# Patient Record
Sex: Female | Born: 1960 | Race: Black or African American | Hispanic: No | Marital: Married | Smoking: Never smoker
Health system: Southern US, Community
[De-identification: ages and names within clinical notes are randomized; demographics above are authoritative.]

## PROBLEM LIST (undated history)

## (undated) DIAGNOSIS — E785 Hyperlipidemia, unspecified: Secondary | ICD-10-CM

## (undated) DIAGNOSIS — I10 Essential (primary) hypertension: Secondary | ICD-10-CM

## (undated) HISTORY — DX: Essential (primary) hypertension: I10

## (undated) HISTORY — DX: Hyperlipidemia, unspecified: E78.5

---

## 2003-09-24 ENCOUNTER — Other Ambulatory Visit: Payer: Self-pay

## 2014-01-14 ENCOUNTER — Ambulatory Visit: Payer: Self-pay | Admitting: Family Medicine

## 2014-01-21 ENCOUNTER — Ambulatory Visit: Payer: Self-pay | Admitting: Family Medicine

## 2014-01-27 ENCOUNTER — Ambulatory Visit: Payer: Self-pay | Admitting: Family Medicine

## 2014-01-27 HISTORY — PX: BREAST BIOPSY: SHX20

## 2014-01-29 LAB — PATHOLOGY REPORT

## 2014-02-28 ENCOUNTER — Ambulatory Visit: Payer: Self-pay | Admitting: Gastroenterology

## 2014-10-06 DIAGNOSIS — I10 Essential (primary) hypertension: Secondary | ICD-10-CM | POA: Insufficient documentation

## 2014-10-06 DIAGNOSIS — E785 Hyperlipidemia, unspecified: Secondary | ICD-10-CM | POA: Insufficient documentation

## 2015-12-31 ENCOUNTER — Ambulatory Visit (INDEPENDENT_AMBULATORY_CARE_PROVIDER_SITE_OTHER): Payer: BLUE CROSS/BLUE SHIELD | Admitting: Podiatry

## 2015-12-31 ENCOUNTER — Encounter: Payer: Self-pay | Admitting: Podiatry

## 2015-12-31 ENCOUNTER — Ambulatory Visit (INDEPENDENT_AMBULATORY_CARE_PROVIDER_SITE_OTHER): Payer: BLUE CROSS/BLUE SHIELD

## 2015-12-31 DIAGNOSIS — M7730 Calcaneal spur, unspecified foot: Secondary | ICD-10-CM | POA: Insufficient documentation

## 2015-12-31 DIAGNOSIS — M79674 Pain in right toe(s): Secondary | ICD-10-CM

## 2015-12-31 DIAGNOSIS — L603 Nail dystrophy: Secondary | ICD-10-CM | POA: Diagnosis not present

## 2015-12-31 DIAGNOSIS — M722 Plantar fascial fibromatosis: Secondary | ICD-10-CM

## 2015-12-31 DIAGNOSIS — R52 Pain, unspecified: Secondary | ICD-10-CM

## 2015-12-31 DIAGNOSIS — M7732 Calcaneal spur, left foot: Secondary | ICD-10-CM

## 2015-12-31 MED ORDER — MELOXICAM 15 MG PO TABS: 15.0000 mg | ORAL_TABLET | Freq: Every day | ORAL | Status: DC

## 2015-12-31 NOTE — Progress Notes (Signed)
Subjective:    Patient ID: LYDEA FAYE, female    DOB: Feb 03, 1961, 55 y.o.   MRN: [id number]  HPI  Ms. Espindola presents the also concerns of left heel pain. She states that she first had pain 2015 which lasted for short time and she took over-the-counter anti-inflammatories and the pain did resolve. She states the pain started again in October 2016 to the bottom of her heel. She states that she has pain in the morning she first gets up and then I proceeded working all day at work. She is been icing as well as taking anti-inflammatories and stretching without any relief. She states this turned issue to the back of her heel as well. She denies any swelling or redness. No tingling or numbness. She's had no other treatment. No recent injury or trauma.  She also states that she has a thick, painful nail on the right big toe. She states this area is very painful with shoe gear and pressure. Denies any redness or drainage. She has tried trimming the nail the any relief. No other complaints.   Review of Systems  All other systems reviewed and are negative.      Objective:   Physical Exam General: AAO x3, NAD  Dermatological: The right hallux nail is signficantly hypertrophic, dystrophic and loosen the underlying nail bed. Upon debridement a small amount of drainage underneath the toenail. There is tenderness the entire toenail. Remainder the nails are also somewhat dystrophic, discolored, there is no pain to the toenails. There is no open lesions or pre-ulcerative lesions.  Vascular: Dorsalis Pedis artery and Posterior Tibial artery pedal pulses are 2/4 bilateral with immedate capillary fill time. Pedal hair growth present. No varicosities and no lower extremity edema present bilateral. There is no pain with calf compression, swelling, warmth, erythema.   Neruologic: Grossly intact via light touch bilateral. Vibratory intact via tuning fork bilateral. Protective threshold with Semmes Wienstein  monofilament intact to all pedal sites bilateral. Patellar and Achilles deep tendon reflexes 2+ bilateral. No Babinski or clonus noted bilateral.   Musculoskeletal: Tenderness to palpation along the plantar medial tubercle of the calcaneus at the insertion of plantar fascia on the left foot. There is no pain along the course of the plantar fascia within the arch of the foot. Plantar fascia appears to be intact. There is no pain with lateral compression of the calcaneus or pain with vibratory sensation. There is tenderness along the distal portion the Achilles tendon on the insertion of the calcaneus objective we have there is no pain at this time. There is no defect in the tendon and Thompson test is negative. No other areas of tenderness to bilateral lower extremities.   Gait: Unassisted, Nonantalgic.     Assessment & Plan:  55 year old female left heel pain, likely plantar fasciitis; symptomatic onychodystrophy right hallux -Treatment options discussed including all alternatives, risks, and complications  1. Left heel pain; plantar fasciitis -Etiology of symptoms were discussed -X-rays were obtained and reviewed. Small heel spur. No evidence of acute fracture or stress fracture. -Patient elects to proceed with steroid injection into the left heel. Under sterile skin preparation, a total of 2.5cc of kenalog 10, 0.5% Marcaine plain, and 2% lidocaine plain were infiltrated into the symptomatic area without complication. A band-aid was applied. Patient tolerated the injection well without complication. Post-injection care with discussed with the patient. Discussed with the patient to ice the area over the next couple of days to help prevent a steroid flare.  -  Plantar fascial brace dispensed -Stretching and icing daily. -Discussed shoe gear changes. Discuss orthotics as his been ongoing for quite some time. She elects to proceed with custom orthotics. She was scanned for orthotics and sent to [facility]  labs.  2. Onychodystrophy -Treatment options discussed including all alternatives, risks, and complications -Etiology of symptoms were discussed -Upon debridement of the nail (this was done after treatment of the heel pain), there is a small amount of drainage underneath the toenail. Because of this and the continue pain recommended total nail avulsion. She does not with this done permanently initially the nail to grow back. Under sterile conditions a mixture of lidocaine and Marcaine plain was infiltrated into a hallux block fashion for 3 mL total. Once anesthetized the skin was prepped in sterile fashion. A tourniquet was applied. Next the nail was then removed in total making to remove all nail borders. The nail was sent for biopsy/culture. Hemostasis was achieved and the area was cleaned. Silvadene was applied followed by dressing. After application a dressing there is found to be an immediate capillary refill time to the toe. She tolerated the procedure well any complications. Post procedure care was discussed. Monitor for any clinical signs or symptoms of infection and directed to call the office immediately should any occur or go to the ER.  -Follow-up in 1 week for nail check or sooner if any problems arise. In the meantime, encouraged to call the office with any questions, concerns, change in symptoms.   Celesta Gentile, DPM

## 2015-12-31 NOTE — Patient Instructions (Addendum)
Soak Instructions    THE DAY AFTER THE PROCEDURE  Place 1/4 cup of epsom salts in a quart of warm tap water.  Submerge your foot or feet with outer bandage intact for the initial soak; this will allow the bandage to become moist and wet for easy lift off.  Once you remove your bandage, continue to soak in the solution for 20 minutes.  This soak should be done twice a day.  Next, remove your foot or feet from solution, blot dry the affected area and cover.  You may use a band aid large enough to cover the area or use gauze and tape.  Apply other medications to the area as directed by the doctor such as polysporin neosporin.  IF YOUR SKIN BECOMES IRRITATED WHILE USING THESE INSTRUCTIONS, IT IS OKAY TO SWITCH TO  WHITE VINEGAR AND WATER. Or you may use antibacterial soap and water to keep the toe clean  Monitor for any signs/symptoms of infection. Call the office immediately if any occur or go directly to the emergency room. Call with any questions/concerns.    Plantar Fasciitis With Rehab The plantar fascia is a fibrous, ligament-like, soft-tissue structure that spans the bottom of the foot. Plantar fasciitis, also called heel spur syndrome, is a condition that causes pain in the foot due to inflammation of the tissue. SYMPTOMS   Pain and tenderness on the underneath side of the foot.  Pain that worsens with standing or walking. CAUSES  Plantar fasciitis is caused by irritation and injury to the plantar fascia on the underneath side of the foot. Common mechanisms of injury include:  Direct trauma to bottom of the foot.  Damage to a small nerve that runs under the foot where the main fascia attaches to the heel bone.  Stress placed on the plantar fascia due to bone spurs. RISK INCREASES WITH:   Activities that place stress on the plantar fascia (running, jumping, pivoting, or cutting).  Poor strength and flexibility.  Improperly fitted shoes.  Tight calf muscles.  Flat  feet.  Failure to warm-up properly before activity.  Obesity. PREVENTION  Warm up and stretch properly before activity.  Allow for adequate recovery between workouts.  Maintain physical fitness:  Strength, flexibility, and endurance.  Cardiovascular fitness.  Maintain a health body weight.  Avoid stress on the plantar fascia.  Wear properly fitted shoes, including arch supports for individuals who have flat feet. PROGNOSIS  If treated properly, then the symptoms of plantar fasciitis usually resolve without surgery. However, occasionally surgery is necessary. RELATED COMPLICATIONS   Recurrent symptoms that may result in a chronic condition.  Problems of the lower back that are caused by compensating for the injury, such as limping.  Pain or weakness of the foot during push-off following surgery.  Chronic inflammation, scarring, and partial or complete fascia tear, occurring more often from repeated injections. TREATMENT  Treatment initially involves the use of ice and medication to help reduce pain and inflammation. The use of strengthening and stretching exercises may help reduce pain with activity, especially stretches of the Achilles tendon. These exercises may be performed at home or with a therapist. Your caregiver may recommend that you use heel cups of arch supports to help reduce stress on the plantar fascia. Occasionally, corticosteroid injections are given to reduce inflammation. If symptoms persist for greater than 6 months despite non-surgical (conservative), then surgery may be recommended.  MEDICATION   If pain medication is necessary, then nonsteroidal anti-inflammatory medications, such as aspirin and  ibuprofen, or other minor pain relievers, such as acetaminophen, are often recommended.  Do not take pain medication within 7 days before surgery.  Prescription pain relievers may be given if deemed necessary by your caregiver. Use only as directed and only as  much as you need.  Corticosteroid injections may be given by your caregiver. These injections should be reserved for the most serious cases, because they may only be given a certain number of times. HEAT AND COLD  Cold treatment (icing) relieves pain and reduces inflammation. Cold treatment should be applied for 10 to 15 minutes every 2 to 3 hours for inflammation and pain and immediately after any activity that aggravates your symptoms. Use ice packs or massage the area with a piece of ice (ice massage).  Heat treatment may be used prior to performing the stretching and strengthening activities prescribed by your caregiver, physical therapist, or athletic trainer. Use a heat pack or soak the injury in warm water. SEEK IMMEDIATE MEDICAL CARE IF:  Treatment seems to offer no benefit, or the condition worsens.  Any medications produce adverse side effects. EXERCISES RANGE OF MOTION (ROM) AND STRETCHING EXERCISES - Plantar Fasciitis (Heel Spur Syndrome) These exercises may help you when beginning to rehabilitate your injury. Your symptoms may resolve with or without further involvement from your physician, physical therapist or athletic trainer. While completing these exercises, remember:   Restoring tissue flexibility helps normal motion to return to the joints. This allows healthier, less painful movement and activity.  An effective stretch should be held for at least 30 seconds.  A stretch should never be painful. You should only feel a gentle lengthening or release in the stretched tissue. RANGE OF MOTION - Toe Extension, Flexion  Sit with your right / left leg crossed over your opposite knee.  Grasp your toes and gently pull them back toward the top of your foot. You should feel a stretch on the bottom of your toes and/or foot.  Hold this stretch for __________ seconds.  Now, gently pull your toes toward the bottom of your foot. You should feel a stretch on the top of your toes and or  foot.  Hold this stretch for __________ seconds. Repeat __________ times. Complete this stretch __________ times per day.  RANGE OF MOTION - Ankle Dorsiflexion, Active Assisted  Remove shoes and sit on a chair that is preferably not on a carpeted surface.  Place right / left foot under knee. Extend your opposite leg for support.  Keeping your heel down, slide your right / left foot back toward the chair until you feel a stretch at your ankle or calf. If you do not feel a stretch, slide your bottom forward to the edge of the chair, while still keeping your heel down.  Hold this stretch for __________ seconds. Repeat __________ times. Complete this stretch __________ times per day.  STRETCH - Gastroc, Standing  Place hands on wall.  Extend right / left leg, keeping the front knee somewhat bent.  Slightly point your toes inward on your back foot.  Keeping your right / left heel on the floor and your knee straight, shift your weight toward the wall, not allowing your back to arch.  You should feel a gentle stretch in the right / left calf. Hold this position for __________ seconds. Repeat __________ times. Complete this stretch __________ times per day. STRETCH - Soleus, Standing  Place hands on wall.  Extend right / left leg, keeping the other knee somewhat bent.  Slightly point your toes inward on your back foot.  Keep your right / left heel on the floor, bend your back knee, and slightly shift your weight over the back leg so that you feel a gentle stretch deep in your back calf.  Hold this position for __________ seconds. Repeat __________ times. Complete this stretch __________ times per day. STRETCH - Gastrocsoleus, Standing  Note: This exercise can place a lot of stress on your foot and ankle. Please complete this exercise only if specifically instructed by your caregiver.   Place the ball of your right / left foot on a step, keeping your other foot firmly on the same  step.  Hold on to the wall or a rail for balance.  Slowly lift your other foot, allowing your body weight to press your heel down over the edge of the step.  You should feel a stretch in your right / left calf.  Hold this position for __________ seconds.  Repeat this exercise with a slight bend in your right / left knee. Repeat __________ times. Complete this stretch __________ times per day.  STRENGTHENING EXERCISES - Plantar Fasciitis (Heel Spur Syndrome)  These exercises may help you when beginning to rehabilitate your injury. They may resolve your symptoms with or without further involvement from your physician, physical therapist or athletic trainer. While completing these exercises, remember:   Muscles can gain both the endurance and the strength needed for everyday activities through controlled exercises.  Complete these exercises as instructed by your physician, physical therapist or athletic trainer. Progress the resistance and repetitions only as guided. STRENGTH - Towel Curls  Sit in a chair positioned on a non-carpeted surface.  Place your foot on a towel, keeping your heel on the floor.  Pull the towel toward your heel by only curling your toes. Keep your heel on the floor.  If instructed by your physician, physical therapist or athletic trainer, add ____________________ at the end of the towel. Repeat __________ times. Complete this exercise __________ times per day. STRENGTH - Ankle Inversion  Secure one end of a rubber exercise band/tubing to a fixed object (table, pole). Loop the other end around your foot just before your toes.  Place your fists between your knees. This will focus your strengthening at your ankle.  Slowly, pull your big toe up and in, making sure the band/tubing is positioned to resist the entire motion.  Hold this position for __________ seconds.  Have your muscles resist the band/tubing as it slowly pulls your foot back to the starting  position. Repeat __________ times. Complete this exercises __________ times per day.    This information is not intended to replace advice given to you by your health care provider. Make sure you discuss any questions you have with your health care provider.   Document Released: 09/19/2005 Document Revised: 02/03/2015 Document Reviewed: 01/01/2009 Elsevier Interactive Patient Education [employer].

## 2016-01-01 NOTE — Addendum Note (Signed)
Addended by: Cranford Mon R on: 01/01/2016 02:18 PM   Modules accepted: Orders

## 2016-01-07 ENCOUNTER — Ambulatory Visit (INDEPENDENT_AMBULATORY_CARE_PROVIDER_SITE_OTHER): Payer: BLUE CROSS/BLUE SHIELD | Admitting: Podiatry

## 2016-01-07 ENCOUNTER — Encounter: Payer: Self-pay | Admitting: Podiatry

## 2016-01-07 DIAGNOSIS — M722 Plantar fascial fibromatosis: Secondary | ICD-10-CM | POA: Diagnosis not present

## 2016-01-07 DIAGNOSIS — Z9889 Other specified postprocedural states: Secondary | ICD-10-CM

## 2016-01-07 NOTE — Progress Notes (Signed)
Patient ID: [name], female   DOB: [birth year], 55 y.o.   MRN: [id number]  Subjective: Regina Flores is a 55 y.o.  female returns to office today for follow up evaluation after having right Hallux total nail avulsion performed. Patient has been soaking using epsom salts and applying topical antibiotic covered with bandaid daily. She denies any swelling or redness or any drainage. Area is tender mostly with shoe gear but is improving. She also states her left heel is still bothering her. She patient of the injection. She has been stretching icing daily. She is continued plantar fascial brace. Patient denies fevers, chills, nausea, vomiting. Denies any calf pain, chest pain, SOB.   Objective:  Vitals: Reviewed  General: Well developed, nourished, in no acute distress, alert and oriented x3   Dermatology: Skin is warm, dry and supple bilateral. Right hallux nail bedappears to be clean, dry, with mild granular tissue and surrounding scab. There is no surrounding erythema, edema, drainage/purulence. The remaining nails appear unremarkable at this time. There are no other lesions or other signs of infection present.  Neurovascular status: Intact. No lower extremity swelling; No pain with calf compression bilateral.  Musculoskeletal: Decreased tenderness to palpation of the right hallux nail bed. There is also continuation tenderness to the plantar medial tubercle calcaneal tendon insertion upon her fascia on the left foot. There is no purulent course of plantar fascial in the arch of the foot. No pain with medial to lateral compression. No family Achilles tendon. No other areas of tenderness to bilateral lower extremities. MMT 5/5. Equinus is present.  Assesement and Plan: S/p partial nail avulsion, doing well; left plantar fasciitis   1. Status post right hallux toenail avulsion  -Continue soaking in epsom salts twice a day followed by antibiotic ointment and a band-aid. Can leave uncovered at  night. Continue this until completely healed.  -If the area has not healed in 2 weeks, call the office for follow-up appointment, or sooner if any problems arise.  -Monitor for any signs/symptoms of infection. Call the office immediately if any occur or go directly to the emergency room. Call with any questions/concerns.  2. Left heel pain, likely plantar fasciitis -Dispensed night splint today. We'll hold off on another steroid injection she does have mild last week. Continue anti-inflammatories. Ice and stretching exercises daily. Continue plantar fascial brace. Continue supportive shoe gear.  Follow-up in 2 weeks or sooner if any issues are to arise.  Celesta Gentile, DPM

## 2016-01-26 ENCOUNTER — Telehealth: Payer: Self-pay | Admitting: *Deleted

## 2016-01-26 NOTE — Telephone Encounter (Addendum)
Dr. Jacqualyn Posey reviewed 12/31/2015 fungal culture as +, make sure pt has an appt.  Pt is scheduled to see Dr. Jacqualyn Posey on 01/28/2016.  01/29/2016-DrJacqualyn Posey reviewed Hepatic Function and CBC with Diff as with in normal limits, ordered that pt may begin her lamisil.  Orders to pt.

## 2016-01-28 ENCOUNTER — Encounter: Payer: Self-pay | Admitting: Podiatry

## 2016-01-28 ENCOUNTER — Ambulatory Visit (INDEPENDENT_AMBULATORY_CARE_PROVIDER_SITE_OTHER): Payer: BLUE CROSS/BLUE SHIELD | Admitting: Podiatry

## 2016-01-28 DIAGNOSIS — Z79899 Other long term (current) drug therapy: Secondary | ICD-10-CM | POA: Diagnosis not present

## 2016-01-28 DIAGNOSIS — B351 Tinea unguium: Secondary | ICD-10-CM

## 2016-01-28 DIAGNOSIS — M722 Plantar fascial fibromatosis: Secondary | ICD-10-CM | POA: Diagnosis not present

## 2016-01-28 MED ORDER — TERBINAFINE HCL 250 MG PO TABS: 250.0000 mg | ORAL_TABLET | Freq: Every day | ORAL | Status: DC

## 2016-01-28 NOTE — Progress Notes (Signed)
Patient ID: Regina Flores, female   DOB: 1961/09/03, 55 y.o.   MRN: [id number]  Subjective: 55 year old female presents the office they for follow-up evaluation of right big toe status post avulsion and to discuss nail biopsy results. She says the toe so while not having any issues. No drainage or pus or any surrounding redness or swelling. She also states her left heel has continued be painful. She has been stretching, icing she is currently taking oral steroids. She is also continue with the plantar fascial brace as well as the night splint and night. No numbness or tingling. The pain is to the bottom of the heel. Denies any systemic complaints such as fevers, chills, nausea, vomiting. No acute changes since last appointment, and no other complaints at this time.   Objective: AAO x3, NAD; presents wearing high healed shoes DP/PT pulses palpable bilaterally, CRT less than 3 seconds Protective sensation intact with Simms Weinstein monofilament There is continued tenderness to palpation along the plantar medial tubercle of the calcaneus at the insertion of plantar fascia on the left foot. There is no pain along the course of the plantar fascia within the arch of the foot. Plantar fascia appears to be intact. There is no pain with lateral compression of the calcaneus or pain with vibratory sensation. There is no pain along the course or insertion of the achilles tendon. No other areas of tenderness to bilateral lower extremities. No areas of pinpoint bony tenderness or pain with vibratory sensation. MMT 5/5, ROM WNL. No edema, erythema, increase in warmth to bilateral lower extremities.  The right hallux toenail has healed. There is no drainage or pus or any swelling redness or swelling. No open lesions or pre-ulcerative lesions.  No pain with calf compression, swelling, warmth, erythema  Assessment: Left continued heel pain, plantar fasciitis; onychomycosis  Plan: -All treatment options discussed  with the patient including all alternatives, risks, complications.  -No culture results were discussed the patient which did reveal onychomycosis. After discussion of treatment options has elected to proceed with oral treatment. Prescribed Lamisil. Work is provided today she should not start the Lamisil until I call her with results of the blood work. -Patient elects to proceed with steroid injection into the left heel. Under sterile skin preparation, a total of 2.5cc of kenalog 10, 0.5% Marcaine plain, and 2% lidocaine plain were infiltrated into the symptomatic area without complication. A band-aid was applied. Patient tolerated the injection well without complication. Post-injection care with discussed with the patient. Discussed with the patient to ice the area over the next couple of days to help prevent a steroid flare. Finish course of Medrol Dosepak. Once this complete she can restart meloxicam. Continue stretching, icing. Discussed shoe gear modifications. Plantar fascial taping applied. She is still awaiting orthotics. -F/U 4 weeks  -Patient encouraged to call the office with any questions, concerns, change in symptoms.   Celesta Gentile, DPM

## 2016-01-28 NOTE — Patient Instructions (Signed)

## 2016-02-25 ENCOUNTER — Encounter: Payer: Self-pay | Admitting: Podiatry

## 2016-02-25 ENCOUNTER — Ambulatory Visit (INDEPENDENT_AMBULATORY_CARE_PROVIDER_SITE_OTHER): Payer: BLUE CROSS/BLUE SHIELD | Admitting: Podiatry

## 2016-02-25 DIAGNOSIS — M722 Plantar fascial fibromatosis: Secondary | ICD-10-CM

## 2016-02-25 DIAGNOSIS — B351 Tinea unguium: Secondary | ICD-10-CM

## 2016-02-25 MED ORDER — TERBINAFINE HCL 250 MG PO TABS: 250.0000 mg | ORAL_TABLET | Freq: Every day | ORAL | Status: DC

## 2016-02-26 ENCOUNTER — Telehealth: Payer: Self-pay | Admitting: *Deleted

## 2016-02-26 NOTE — Telephone Encounter (Signed)
i had a rush put on the patient's inserts and will be here next week and i will call the patient when the inserts arrive. Regina Flores

## 2016-03-03 NOTE — Progress Notes (Signed)
Patient ID: Regina Flores, female   DOB: April 14, 1961, 55 y.o.   MRN: [id number]  Subjective: 55 year old female presents the office they for follow-up evaluation of this on Lamisil as well as for left heel pain. She states the Lamisil and well. She is an occasional bad taste or bowel however she is having no other side effects. She has not noticed much change in her toenails over the last month. No pain to the toenails. She states her heels are doing better. She is awaiting orthotics. She states that the injections to help. She does continue with stretching, icing. Denies any systemic complaints such as fevers, chills, nausea, vomiting. No acute changes since last appointment, and no other complaints at this time.   Objective: AAO x3, NAD; presents wearing high healed shoes DP/PT pulses palpable bilaterally, CRT less than 3 seconds Protective sensation intact with Simms Weinstein monofilament There is improved, but continued tenderness to palpation along the plantar medial tubercle of the calcaneus at the insertion of plantar fascia on the left foot. There is no pain along the course of the plantar fascia within the arch of the foot. Plantar fascia appears to be intact. There is no pain with lateral compression of the calcaneus or pain with vibratory sensation. There is no pain along the course or insertion of the achilles tendon. No other areas of tenderness to bilateral lower extremities. No edema, erythema, increase in warmth to bilateral lower extremities.  Nails appear to be hypertrophic, dystrophic, discolored, brittle. No tenderness the toenails. No pain with calf compression, swelling, warmth, erythema  Assessment: Left continued heel pain, plantar fasciitis; onychomycosis  Plan: -All treatment options discussed with the patient including all alternatives, risks, complications.  1. Onychomycosis -At this time I discussed treatment options again for onychomycosis. She elected change her  treatment plan at this point. I discussed her other treatments. We will switch to pulse dose of Lamisil with monthly lasering and debridement. We'll start this the first week of June. I'll have her follow-up in the [place] office for this.  2. Plantar fasciitis -Improving however with continued pain. -Patient elects to proceed with steroid injection into the left heel. Under sterile skin preparation, a total of 2.5cc of kenalog 10, 0.5% Marcaine plain, and 2% lidocaine plain were infiltrated into the symptomatic area without complication. A band-aid was applied. Patient tolerated the injection well without complication. Post-injection care with discussed with the patient. Discussed with the patient to ice the area over the next couple of days to help prevent a steroid flare.  -Plantar fascial taping was applied. -Awaiting orthotics. -Continue stretching, icing as well she gear changes.   Celesta Gentile, DPM

## 2016-03-07 ENCOUNTER — Ambulatory Visit: Payer: BLUE CROSS/BLUE SHIELD

## 2016-03-07 DIAGNOSIS — B351 Tinea unguium: Secondary | ICD-10-CM

## 2016-03-07 NOTE — Progress Notes (Signed)
   Subjective:    Patient ID: Regina Flores, female    DOB: December 17, 1960, 55 y.o.   MRN: [id number]  HPI Pt presents for Laser treatment of nails 1st,3rd, and 5th   Review of Systems    All other systems negative Objective:   Physical Exam  Onychomycosis nails 1,3,5 bilateral      Assessment & Plan:  Laser therapy #1 administered today to affected nails, all safety precautions in place, pt tolerated proceudre weel. Re-appointed to follow up in 1 month for 2nd of 3 treatments

## 2016-04-11 ENCOUNTER — Other Ambulatory Visit: Payer: BLUE CROSS/BLUE SHIELD

## 2016-06-08 ENCOUNTER — Other Ambulatory Visit: Payer: Self-pay | Admitting: Family Medicine

## 2016-06-08 DIAGNOSIS — Z1239 Encounter for other screening for malignant neoplasm of breast: Secondary | ICD-10-CM

## 2016-07-06 ENCOUNTER — Ambulatory Visit
Admission: RE | Admit: 2016-07-06 | Discharge: 2016-07-06 | Disposition: A | Discharge status: Home / Self Care | Payer: BLUE CROSS/BLUE SHIELD | Source: Ambulatory Visit | Attending: Family Medicine | Admitting: Family Medicine

## 2016-07-06 ENCOUNTER — Other Ambulatory Visit: Payer: Self-pay | Admitting: Family Medicine

## 2016-07-06 DIAGNOSIS — Z1231 Encounter for screening mammogram for malignant neoplasm of breast: Secondary | ICD-10-CM

## 2016-07-06 DIAGNOSIS — Z1239 Encounter for other screening for malignant neoplasm of breast: Secondary | ICD-10-CM

## 2017-08-15 ENCOUNTER — Ambulatory Visit: Payer: BLUE CROSS/BLUE SHIELD | Admitting: Medical

## 2017-08-15 ENCOUNTER — Encounter: Payer: Self-pay | Admitting: Medical

## 2017-08-15 VITALS — BP 148/100 | HR 81 | Temp 98.3°F | Wt 192.8 lb

## 2017-08-15 DIAGNOSIS — S8002XA Contusion of left knee, initial encounter: Secondary | ICD-10-CM

## 2017-08-15 DIAGNOSIS — M7918 Myalgia, other site: Secondary | ICD-10-CM

## 2017-08-15 DIAGNOSIS — Z026 Encounter for examination for insurance purposes: Secondary | ICD-10-CM

## 2017-08-15 DIAGNOSIS — M25521 Pain in right elbow: Secondary | ICD-10-CM

## 2017-08-15 NOTE — Progress Notes (Addendum)
   Subjective:    Patient ID: Regina Flores, female    DOB: 02-Apr-1961, 56 y.o.   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  Blood pressure (!) 148/100, pulse 81, temperature 98.3 F (36.8 C), temperature source Tympanic, weight 192 lb 12.8 oz (87.5 kg), SpO2 98 %.  Review of Systems  Constitutional: Negative for chills and fever.  HENT: Negative for congestion, ear discharge and sore throat.   Eyes: Negative for discharge and itching.  Respiratory: Negative for cough and shortness of breath.   Cardiovascular: Negative for chest pain, palpitations and leg swelling.  Gastrointestinal: Negative for abdominal pain.  Endocrine: Negative for cold intolerance and heat intolerance.  Genitourinary: Negative for dysuria.  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 Negative for back pain, gait problem and joint swelling.  Skin: Negative for rash.  Allergic/Immunologic: Negative for environmental allergies, food allergies and immunocompromised state.  Neurological: Negative for dizziness, syncope and light-headedness.  Hematological: Negative for adenopathy.  Psychiatric/Behavioral: Negative for behavioral problems, self-injury and suicidal ideas. The patient is not nervous/anxious.    Takes blood pressure medication in the morning has not had mornining dose yet.     Objective:   Physical Exam  Constitutional: She is oriented to person, place, and time. She appears well-developed and well-nourished.  Eyes: Conjunctivae and EOM are normal. Pupils are equal, round, and reactive to light.  Neck: Normal range of motion.  Musculoskeletal: Normal range of motion. She exhibits tenderness. She exhibits no edema or  deformity.  Neurological: She is alert and oriented to person, place, and time.  Skin: Skin is warm and dry.  Psychiatric: She has a normal mood and affect. Her behavior is normal. Judgment and thought content normal.  Nursing note and vitals reviewed.  No vertebral tenderness, No bruising or swelling to left buttock. Right elbow tender medial proximal condyle ,no swelling or bruising, FROM 2+ radial pulse Left knee , non tender , negative drawers test medial and lateral , no laxity of tendons, no swelling or bruisiing noted.FROM 2+ popliteal pulse Left buttock pain no swelling or bruising noted.  Gait within normal limits Full weight bearing.    recheck bp  140/100 Assessment & Plan:  Worker compensation,  Fall ,Left knee contusion, right elbow pain , left buttock pain. Ice , elevate, OTC Ibuprofen 600mg  every  6 hours as needed for pain.  Given  Ibuprofen in clinic  800mg  po [id number] exp 08/2019. And  2 ice packs Follow up in one week for recheck , sooner if any concerns.  Patient verbalizes understanding and has no questions at discharge.

## 2017-08-15 NOTE — Patient Instructions (Signed)
Return in one week 11/120/18   Ice Massage and [place] and cold packs are used so that we can return the muscle to it's natural resting state without causing more pain, which can lead to more spasm, etc.  Icing and cold packs are also used to reduce swelling, which can lead to pain and stiffness.  COLD PACKS Cold packs should be placed circumferentially around the swollen area (ie., the wrist, etc.).  A paper towel can be placed over the area before the cold pack is applied.  A towel may be wrapped around the outside of the cold pack to keep the cold in.  The swollen area should be elevated with the cold pack, if possible.  ICE MASSAGE Fill a 4 ounce paper cup three-quarters full and put it in a freezer until it is frozen.  When ready to use, tear off about 1 inch of the cup so that some of the ice is showing while the bottom of the cup can be used to hold onto. Massage the entire muscle area as instructed by your therapist.  You may use circular or up and down strokes, but do not hold the ice in one spot.  Four phases to the ice massage and cold pack application: 1. Cold: which you feel when you first apply the ice. 2. Ache: after a few minutes 3. Burning: after approximately five minutes, it will feel like your skin is burning.  At this point, remove the ice for a minute or so. 4. Numbness: THIS IS THE CRUCIAL PHASE!!! Return the ice or cold pack and massage until all the burning disappears.  This signals the end of cryotherapy.  The entire procedure should take ten to fifteen minutes while using the cold pack.  Do Not perform the ice massage for more than seven minutes on a small area or more than ten minutes on a large area.  Cryotherapy should be performed after exercises, when edema occurs, or when an area is painful.Contusion A contusion is a deep bruise. Contusions happen when an injury causes bleeding under the skin. Symptoms of bruising include pain, swelling, and  discolored skin. The skin may turn blue, purple, or yellow. Follow these instructions at home:  Rest the injured area.  If told, put ice on the injured area. ? Put ice in a plastic bag. ? Place a towel between your skin and the bag. ? Leave the ice on for 20 minutes, 2-3 times per day.  If told, put light pressure (compression) on the injured area using an elastic bandage. Make sure the bandage is not too tight. Remove it and put it back on as told by your doctor.  If possible, raise (elevate) the injured area above the level of your heart while you are sitting or lying down.  Take over-the-counter and prescription medicines only as told by your doctor. Contact a doctor if:  Your symptoms do not get better after several days of treatment.  Your symptoms get worse.  You have trouble moving the injured area. Get help right away if:  You have very bad pain.  You have a loss of feeling (numbness) in a hand or foot.  Your hand or foot turns pale or cold. This information is not intended to replace advice given to you by your health care provider. Make sure you discuss any questions you have with your health care provider. Document Released: 03/07/2008 Document Revised: 02/25/2016 Document Reviewed: 02/04/2015 Elsevier Interactive Patient Education  2018 Elsevier Inc.  

## 2017-08-23 ENCOUNTER — Ambulatory Visit: Payer: BLUE CROSS/BLUE SHIELD | Admitting: Medical

## 2018-04-16 ENCOUNTER — Other Ambulatory Visit: Payer: Self-pay | Admitting: Family Medicine

## 2018-04-16 DIAGNOSIS — Z1239 Encounter for other screening for malignant neoplasm of breast: Secondary | ICD-10-CM

## 2018-04-30 ENCOUNTER — Other Ambulatory Visit: Payer: Self-pay | Admitting: Family Medicine

## 2018-04-30 DIAGNOSIS — R58 Hemorrhage, not elsewhere classified: Secondary | ICD-10-CM

## 2018-04-30 DIAGNOSIS — N926 Irregular menstruation, unspecified: Secondary | ICD-10-CM

## 2018-05-09 ENCOUNTER — Ambulatory Visit
Admission: RE | Admit: 2018-05-09 | Discharge: 2018-05-09 | Disposition: A | Discharge status: Home / Self Care | Payer: BLUE CROSS/BLUE SHIELD | Source: Ambulatory Visit | Attending: Family Medicine | Admitting: Family Medicine

## 2018-05-09 DIAGNOSIS — N926 Irregular menstruation, unspecified: Secondary | ICD-10-CM | POA: Diagnosis present

## 2018-05-09 DIAGNOSIS — D259 Leiomyoma of uterus, unspecified: Secondary | ICD-10-CM | POA: Insufficient documentation

## 2018-05-14 ENCOUNTER — Ambulatory Visit
Admission: RE | Admit: 2018-05-14 | Discharge: 2018-05-14 | Disposition: A | Discharge status: Home / Self Care | Payer: BLUE CROSS/BLUE SHIELD | Source: Ambulatory Visit | Attending: Family Medicine | Admitting: Family Medicine

## 2018-05-14 DIAGNOSIS — Z1231 Encounter for screening mammogram for malignant neoplasm of breast: Secondary | ICD-10-CM | POA: Insufficient documentation

## 2018-05-14 DIAGNOSIS — Z1239 Encounter for other screening for malignant neoplasm of breast: Secondary | ICD-10-CM

## 2018-08-03 ENCOUNTER — Ambulatory Visit: Payer: BLUE CROSS/BLUE SHIELD | Admitting: Podiatry

## 2018-08-03 ENCOUNTER — Encounter

## 2018-08-03 ENCOUNTER — Encounter: Payer: Self-pay | Admitting: Podiatry

## 2018-08-03 ENCOUNTER — Ambulatory Visit (INDEPENDENT_AMBULATORY_CARE_PROVIDER_SITE_OTHER): Payer: BLUE CROSS/BLUE SHIELD

## 2018-08-03 DIAGNOSIS — B351 Tinea unguium: Secondary | ICD-10-CM

## 2018-08-03 DIAGNOSIS — M722 Plantar fascial fibromatosis: Secondary | ICD-10-CM

## 2018-08-03 MED ORDER — METHYLPREDNISOLONE 4 MG PO TBPK: ORAL_TABLET | ORAL | 0 refills | Status: DC

## 2018-08-03 MED ORDER — TERBINAFINE HCL 250 MG PO TABS: 250.0000 mg | ORAL_TABLET | Freq: Every day | ORAL | 0 refills | Status: DC

## 2018-08-03 MED ORDER — MELOXICAM 15 MG PO TABS: 15.0000 mg | ORAL_TABLET | Freq: Every day | ORAL | 1 refills | Status: AC

## 2018-08-06 NOTE — Progress Notes (Signed)
   Subjective: 57 year old female presenting today with a chief complaint of intermittent throbbing pain to the left heel that began 4-5 months ago. She states the pain is worse in the morning when she first gets out of bed. Walking and standing also increases the pain. She has been using a heel brace, insoles and taking Tylenol for pain.  She also reports possible fungus of all nails bilaterally that has been ongoing for a few years. She has not done anything for treatment and denies modifying factors. Patient is here for further evaluation and treatment.   Past Medical History:  Diagnosis Date  . Hyperlipidemia   . Hypertension      Objective: Physical Exam General: The patient is alert and oriented x3 in no acute distress.  Dermatology: Hyperkeratotic, discolored, thickened, onychodystrophy of nails noted bilaterally. Skin is warm, dry and supple bilateral lower extremities. Negative for open lesions or macerations bilateral.   Vascular: Dorsalis Pedis and Posterior Tibial pulses palpable bilateral.  Capillary fill time is immediate to all digits.  Neurological: Epicritic and protective threshold intact bilateral.   Musculoskeletal: Tenderness to palpation to the plantar aspect of the left heel along the plantar fascia. All other joints range of motion within normal limits bilateral. Strength 5/5 in all groups bilateral.   Radiographic exam: Normal osseous mineralization. Joint spaces preserved. No fracture/dislocation/boney destruction. No other soft tissue abnormalities or radiopaque foreign bodies.   Assessment: 1. Plantar fasciitis left foot 2. Onychomycosis nails 1-5 bilateral  Plan of Care:  1. Patient evaluated. Xrays reviewed.   2. Injection of 0.5cc Celestone soluspan injected into the left plantar fascia.  3. Rx for Medrol Dose Pak placed 4. Rx for Meloxicam ordered for patient. 5. Prescription for Lamisil 250 mg #90 provided to patient.   6. Instructed patient  regarding therapies and modalities at home to alleviate symptoms.  7. Appointment for custom molded orthotics with Liliane Channel.  8. Return to clinic in 4 weeks.     Edrick Kins, DPM [facility]  Dr. Edrick Kins, DPM    [address], [place] [address]                Office [phone]  Fax [phone]

## 2018-08-22 ENCOUNTER — Ambulatory Visit: Payer: BLUE CROSS/BLUE SHIELD | Admitting: Orthotics

## 2018-08-22 DIAGNOSIS — M722 Plantar fascial fibromatosis: Secondary | ICD-10-CM

## 2018-08-22 NOTE — Progress Notes (Signed)
Patient came into today for casting bilateral f/o to address plantar fasciitis.  Patient reports history of foot pain involving plantar aponeurosis.  Goal is to provide longitudinal arch support and correct any RF instability due to heel eversion/inversion.  Ultimate goal is to relieve tension at pf insertion calcaneal tuberosity.  Plan on semi-rigid device addressing heel stability and relieving PF tension.     Patient also has heel spur left, so plan on heel punch and hs cushion.

## 2018-09-12 ENCOUNTER — Ambulatory Visit: Payer: BLUE CROSS/BLUE SHIELD | Admitting: Orthotics

## 2018-09-12 DIAGNOSIS — M7730 Calcaneal spur, unspecified foot: Secondary | ICD-10-CM

## 2018-09-12 DIAGNOSIS — M722 Plantar fascial fibromatosis: Secondary | ICD-10-CM

## 2018-09-12 NOTE — Progress Notes (Signed)
Patient came in today to pick up custom made foot orthotics.  The goals were accomplished and the patient reported no dissatisfaction with said orthotics.  Patient was advised of breakin period and how to report any issues.Patient came in today to pick up custom made foot orthotics.  The goals were accomplished and the patient reported no dissatisfaction with said orthotics.  Patient was advised of breakin period and how to report any issues. 

## 2018-10-05 ENCOUNTER — Encounter: Payer: Self-pay | Admitting: Podiatry

## 2018-10-05 ENCOUNTER — Ambulatory Visit: Payer: BLUE CROSS/BLUE SHIELD | Admitting: Podiatry

## 2018-10-05 DIAGNOSIS — B351 Tinea unguium: Secondary | ICD-10-CM

## 2018-10-05 DIAGNOSIS — M722 Plantar fascial fibromatosis: Secondary | ICD-10-CM | POA: Diagnosis not present

## 2018-10-08 NOTE — Progress Notes (Signed)
   Subjective: 58 year old female presenting today for follow up evaluation of plantar fasciitis of the left foot and onychomycosis bilaterally. She states the left foot pain is about the same and has not improved nor worsened. She reports continued significant pain at times, especially when she is on her feet a lot. She has been taking Meloxicam as directed.  She states the nail fungus is slowly improving. She has been taking the Lamisil as directed. There are no modifying factors noted. She denies any new concerns or complaints at this time. Patient is here for further evaluation and treatment.   Past Medical History:  Diagnosis Date  . Hyperlipidemia   . Hypertension      Objective: Physical Exam General: The patient is alert and oriented x3 in no acute distress.  Dermatology: Hyperkeratotic, discolored, thickened, onychodystrophy of nails noted bilaterally. Skin is warm, dry and supple bilateral lower extremities. Negative for open lesions or macerations bilateral.   Vascular: Dorsalis Pedis and Posterior Tibial pulses palpable bilateral.  Capillary fill time is immediate to all digits.  Neurological: Epicritic and protective threshold intact bilateral.   Musculoskeletal: Tenderness to palpation to the plantar aspect of the left heel along the plantar fascia. All other joints range of motion within normal limits bilateral. Strength 5/5 in all groups bilateral.   Assessment: 1. Plantar fasciitis left foot 2. Onychomycosis nails 1-5 bilateral  Plan of Care:  1. Patient evaluated.  2. Continue using custom molded orthotics.  3. Continue taking Meloxicam daily.  4. Continue taking Lamisil 250 mg daily until prescription is finished.  5. Night splint dispensed.  6. Return to clinic as needed.    Edrick Kins, DPM [facility]  Dr. Edrick Kins, DPM    [address], [place] [address]                Office [phone]  Fax [phone]

## 2018-11-23 ENCOUNTER — Encounter: Payer: Self-pay | Admitting: Podiatry

## 2018-11-23 ENCOUNTER — Ambulatory Visit: Payer: BLUE CROSS/BLUE SHIELD | Admitting: Podiatry

## 2018-11-23 DIAGNOSIS — M722 Plantar fascial fibromatosis: Secondary | ICD-10-CM

## 2018-11-23 NOTE — Patient Instructions (Signed)
Pre-Operative Instructions  Congratulations, you have decided to take an important step towards improving your quality of life.  You can be assured that the doctors and staff at [facility] will be with you every step of the way.  Here are some important things you should know:  1. Plan to be at the surgery center/hospital at least 1 (one) hour prior to your scheduled time, unless otherwise directed by the surgical center/hospital staff.  You must have a responsible adult accompany you, remain during the surgery and drive you home.  Make sure you have directions to the surgical center/hospital to ensure you arrive on time. 2. If you are having surgery at [facility] or [facility] hospitals, you will need a copy of your medical history and physical form from your family physician within one month prior to the date of surgery. We will give you a form for your primary physician to complete.  3. We make every effort to accommodate the date you request for surgery.  However, there are times where surgery dates or times have to be moved.  We will contact you as soon as possible if a change in schedule is required.   4. No aspirin/ibuprofen for one week before surgery.  If you are on aspirin, any non-steroidal anti-inflammatory medications (Mobic, Aleve, Ibuprofen) should not be taken seven (7) days prior to your surgery.  You make take Tylenol for pain prior to surgery.  5. Medications - If you are taking daily heart and blood pressure medications, seizure, reflux, allergy, asthma, anxiety, pain or diabetes medications, make sure you notify the surgery center/hospital before the day of surgery so they can tell you which medications you should take or avoid the day of surgery. 6. No food or drink after midnight the night before surgery unless directed otherwise by surgical center/hospital staff. 7. No alcoholic beverages 24-hours prior to surgery.  No smoking 24-hours prior or 24-hours after  surgery. 8. Wear loose pants or shorts. They should be loose enough to fit over bandages, boots, and casts. 9. Don't wear slip-on shoes. Sneakers are preferred. 10. Bring your boot with you to the surgery center/hospital.  Also bring crutches or a walker if your physician has prescribed it for you.  If you do not have this equipment, it will be provided for you after surgery. 11. If you have not been contacted by the surgery center/hospital by the day before your surgery, call to confirm the date and time of your surgery. 12. Leave-time from work may vary depending on the type of surgery you have.  Appropriate arrangements should be made prior to surgery with your employer. 13. Prescriptions will be provided immediately following surgery by your doctor.  Fill these as soon as possible after surgery and take the medication as directed. Pain medications will not be refilled on weekends and must be approved by the doctor. 14. Remove nail polish on the operative foot and avoid getting pedicures prior to surgery. 15. Wash the night before surgery.  The night before surgery wash the foot and leg well with water and the antibacterial soap provided. Be sure to pay special attention to beneath the toenails and in between the toes.  Wash for at least three (3) minutes. Rinse thoroughly with water and dry well with a towel.  Perform this wash unless told not to do so by your physician.  Enclosed: 1 Ice pack (please put in freezer the night before surgery)   1 Hibiclens skin cleaner     Pre-op instructions  If you have any questions regarding the instructions, please do not hesitate to call our office.  [place]: [address], [place], [place] [address] -- [phone]  [place]: [address] [place], [place] [address] -- [phone]  [place]: [address]  [place], [place] [address] -- [phone]  [place]: [address], [address], [place],  [address] -- [phone]  Website:  [email or web address] 

## 2018-11-26 NOTE — Progress Notes (Signed)
   Subjective: 58 year old female presenting today for follow up evaluation of plantar fasciitis of the left foot and onychomycosis bilaterally. She reports a significant amount of continued pain. She has been using the night splint, ankle brace and taking Meloxicam and states nothing is helping. She denies any modifying factors. Patient is here for further evaluation and treatment.   Past Medical History:  Diagnosis Date  . Hyperlipidemia   . Hypertension      Objective: Physical Exam General: The patient is alert and oriented x3 in no acute distress.  Dermatology: Hyperkeratotic, discolored, thickened, onychodystrophy of nails noted bilaterally. Skin is warm, dry and supple bilateral lower extremities. Negative for open lesions or macerations bilateral.   Vascular: Dorsalis Pedis and Posterior Tibial pulses palpable bilateral.  Capillary fill time is immediate to all digits.  Neurological: Epicritic and protective threshold intact bilateral.   Musculoskeletal: Tenderness to palpation to the plantar aspect of the left heel along the plantar fascia. All other joints range of motion within normal limits bilateral. Strength 5/5 in all groups bilateral.   Assessment: 1. Plantar fasciitis left foot 2. Onychomycosis nails 1-5 bilateral  Plan of Care:  1. Patient evaluated.  2. Injection of 0.5 mLs Celestone Soluspan injected into the left plantar fascia.  3. Today we discussed the conservative versus surgical management of the presenting pathology. The patient opts for surgical management. All possible complications and details of the procedure were explained. All patient questions were answered. No guarantees were expressed or implied. 4. Authorization for surgery was initiated today. Surgery will consist of EPF left. 5. Continue using custom molded orthotics.  6. Continue taking Meloxicam daily.  7. Continue taking Lamisil 250 mg daily until prescription is finished.  8. Return to  clinic one week post op.   Edrick Kins, DPM [facility]  Dr. Edrick Kins, DPM    [address], [place] [address]                Office [phone]  Fax [phone]

## 2018-11-27 ENCOUNTER — Telehealth: Payer: Self-pay | Admitting: *Deleted

## 2018-11-27 NOTE — Telephone Encounter (Signed)
"  I was calling to see when I can schedule an appointment to have surgery.  Call me back at your convenience."

## 2018-11-29 NOTE — Telephone Encounter (Signed)
"  I'm calling to schedule my surgery with Dr. Amalia Hailey."  Dr. Amalia Hailey can do your surgery on January 03, 2019.  Is this date okay with you?  "Yes, that will be okay."  I'll get it scheduled.  Someone from the surgical center will give you a call a day or two prior to your surgery date.  They will give you your arrival time.  If you have access to a computer, you need to go online and register with the surgical center.  The instructions are in the brochure that we gave you.

## 2018-12-03 ENCOUNTER — Ambulatory Visit: Payer: BLUE CROSS/BLUE SHIELD | Admitting: Podiatry

## 2018-12-21 ENCOUNTER — Telehealth: Payer: Self-pay | Admitting: *Deleted

## 2018-12-21 NOTE — Telephone Encounter (Signed)
I am returning your call.  At this time, your surgery is still scheduled for April 2.  We are waiting to hear from the [facility] staff about their intentions for the upcoming surgeries.  "Okay, thanks for letting me know.  Please keep me updated about what's going on."  I sure will."

## 2018-12-21 NOTE — Telephone Encounter (Signed)
"  I have surgery scheduled for April 2.  I was told that I needed to call you to reschedule my surgery."  At this time you are still scheduled for that date.  I am waiting to hear something from the surgical center.  Would you like to go ahead and reschedule your date?  "No, I'll wait.  Will you call me and let me know whenever you hear something?"  Yes, I will give you a call.  "Call me on my home number please."

## 2018-12-26 NOTE — Telephone Encounter (Signed)
"  I was just calling to touch base about whether or not I'm still scheduled for my surgery on April 2.  Thank you."  I left her a message that we must reschedule her appointment.  I informed her that I was going to reschedule her to January 10, 2019.  I asked her to give me a call if that date is not convenient for her.

## 2018-12-27 ENCOUNTER — Telehealth: Payer: Self-pay | Admitting: *Deleted

## 2018-12-27 NOTE — Telephone Encounter (Signed)
"  I'm just calling to let you know April 9 will be fine for my surgery."

## 2019-01-04 ENCOUNTER — Telehealth: Payer: Self-pay | Admitting: *Deleted

## 2019-01-04 NOTE — Telephone Encounter (Signed)
I am calling to reschedule your surgery.  Dr. Amalia Hailey can do it on Feb 28, 2019.  "Okay, I'm writing it down."  Be safe.  I rescheduled Regina Flores surgery from 01/10/2019 to 02/28/2019 via the surgical center's [facility] [employer] Portal.

## 2019-01-24 ENCOUNTER — Telehealth: Payer: Self-pay | Admitting: *Deleted

## 2019-01-24 NOTE — Telephone Encounter (Signed)
"  I'm calling you regarding your surgery date.  The surgical center has reopened.  We can do your surgery sooner if you would like.  "How soon can he do it?"  He can do it Thursday, January 31, 2019 or he can do it on Feb 07, 2019.  "I can do it on April 30."  I'll get it rescheduled to January 31, 2019.  You'll get a call from someone at the surgical center a day or two prior to your surgery date.  They will give you your arrival time.  I rescheduled the surgery from 02/28/2019 to 01/31/2019 via the surgical centers' [employer] Portal.

## 2019-01-29 ENCOUNTER — Telehealth: Payer: Self-pay | Admitting: *Deleted

## 2019-01-29 NOTE — Telephone Encounter (Signed)
DOS 01/31/2019,  ENDOSCOPIC PLANTAR FASCIOTOMY LEFT FOOT - CPT CODE 67544  BCBS Policy Effective : 92/10/69 - 21/97/5883  PRE-CERT NOT REQUIRED  In-Network    Max Per Benefit Period Year-to-Date Remaining  CoInsurance  30%   Deductible  $700.00 $700.00  Out-Of-Pocket  $5500.00 $5387.12    AMBULATORY SURGERY  In Network  [employer] Coinsurance  Not Applicable  25% per [place]

## 2019-01-31 ENCOUNTER — Other Ambulatory Visit: Payer: Self-pay | Admitting: Podiatry

## 2019-01-31 DIAGNOSIS — M722 Plantar fascial fibromatosis: Secondary | ICD-10-CM | POA: Diagnosis not present

## 2019-01-31 MED ORDER — OXYCODONE-ACETAMINOPHEN 5-325 MG PO TABS: 1.0000 | ORAL_TABLET | Freq: Four times a day (QID) | ORAL | 0 refills | Status: DC | PRN

## 2019-01-31 NOTE — Progress Notes (Signed)
.  postop

## 2019-02-07 ENCOUNTER — Encounter: Payer: Self-pay | Admitting: Podiatry

## 2019-02-08 ENCOUNTER — Encounter: Payer: Self-pay | Admitting: Podiatry

## 2019-02-08 ENCOUNTER — Ambulatory Visit (INDEPENDENT_AMBULATORY_CARE_PROVIDER_SITE_OTHER): Payer: BLUE CROSS/BLUE SHIELD | Admitting: Podiatry

## 2019-02-08 ENCOUNTER — Other Ambulatory Visit: Payer: Self-pay

## 2019-02-08 ENCOUNTER — Encounter: Payer: BLUE CROSS/BLUE SHIELD | Admitting: Podiatry

## 2019-02-08 VITALS — Temp 97.5°F

## 2019-02-08 DIAGNOSIS — Z9889 Other specified postprocedural states: Secondary | ICD-10-CM

## 2019-02-08 DIAGNOSIS — M722 Plantar fascial fibromatosis: Secondary | ICD-10-CM

## 2019-02-15 ENCOUNTER — Other Ambulatory Visit: Payer: Self-pay

## 2019-02-15 ENCOUNTER — Ambulatory Visit (INDEPENDENT_AMBULATORY_CARE_PROVIDER_SITE_OTHER): Payer: BLUE CROSS/BLUE SHIELD | Admitting: Podiatry

## 2019-02-15 ENCOUNTER — Encounter: Payer: BLUE CROSS/BLUE SHIELD | Admitting: Podiatry

## 2019-02-15 VITALS — Temp 98.2°F

## 2019-02-15 DIAGNOSIS — Z9889 Other specified postprocedural states: Secondary | ICD-10-CM

## 2019-02-15 DIAGNOSIS — M722 Plantar fascial fibromatosis: Secondary | ICD-10-CM

## 2019-02-18 NOTE — Progress Notes (Signed)
   Subjective:  Patient presents today status post EPF left. DOS: 01/31/2019. She states she is improving. She denies any significant pain or modifying factors. She has been using the CAM boot as directed. Patient is here for further evaluation and treatment.    Past Medical History:  Diagnosis Date  . Hyperlipidemia   . Hypertension       Objective/Physical Exam Neurovascular status intact.  Skin incisions appear to be well coapted with sutures and staples intact. No sign of infectious process noted. No dehiscence. No active bleeding noted. Moderate edema noted to the surgical extremity.  Assessment: 1. s/p EPF left. DOS: 01/31/2019   Plan of Care:  1. Patient was evaluated.  2. Sutures removed.  3. Transition out of CAM boot back into custom orthotics. 4. Return to clinic in 2 weeks.    Edrick Kins, DPM [facility]  Dr. Edrick Kins, [address]                                        [place], [place] [address]                Office [phone]  Fax [phone]

## 2019-02-26 NOTE — Progress Notes (Signed)
   Subjective:  Patient presents today status post EPF left foot. DOS: 01/31/2019.  Patient states that she is doing very well.  She is not having much pain.  Past Medical History:  Diagnosis Date  . Hyperlipidemia   . Hypertension       Objective/Physical Exam Neurovascular status intact.  Skin incisions appear to be well coapted with sutures intact. No sign of infectious process noted. No dehiscence. No active bleeding noted. Moderate edema noted to the surgical extremity.  Radiographic Exam:  Orthopedic hardware and osteotomies sites appear to be stable with routine healing.  Assessment: 1. s/p EPF left foot. DOS: 01/31/2019   Plan of Care:  1. Patient was evaluated. 2.  Today dressings were changed.   3.  Continue weightbearing in the cam boot 4.  Return to clinic in 1 week for suture removal   Edrick Kins, DPM [facility]  Dr. Edrick Kins, [address]                                        [place], [place] [address]                Office [phone]  Fax [phone]

## 2019-03-01 ENCOUNTER — Other Ambulatory Visit: Payer: Self-pay

## 2019-03-01 ENCOUNTER — Ambulatory Visit (INDEPENDENT_AMBULATORY_CARE_PROVIDER_SITE_OTHER): Payer: Self-pay | Admitting: Podiatry

## 2019-03-01 VITALS — Temp 97.6°F

## 2019-03-01 DIAGNOSIS — Z9889 Other specified postprocedural states: Secondary | ICD-10-CM

## 2019-03-01 DIAGNOSIS — M722 Plantar fascial fibromatosis: Secondary | ICD-10-CM

## 2019-03-04 NOTE — Progress Notes (Signed)
   Subjective:  Patient presents today status post EPF left. DOS: 01/31/2019. She states her pain is improving but notes some intermittent pain the last two days. She has been using the CAM boot as directed. She has been taking Meloxicam as directed. There are no modifying factors noted. Patient is here for further evaluation and treatment.    Past Medical History:  Diagnosis Date  . Hyperlipidemia   . Hypertension       Objective/Physical Exam Neurovascular status intact.  Skin incisions appear to be well coapted. No sign of infectious process noted. No dehiscence. No active bleeding noted. Moderate edema noted to the surgical extremity.  Assessment: 1. s/p EPF left. DOS: 01/31/2019   Plan of Care:  1. Patient was evaluated.  2. Discontinue using CAM boot. Transition into good sneakers.  3. Continue taking Meloxicam as needed.  4. Recommended New Balance walking shoes.  5. Return to clinic in 2 months.    Edrick Kins, DPM [facility]  Dr. Edrick Kins, [address]                                        [place], [place] [address]                Office [phone]  Fax [phone]

## 2019-03-12 ENCOUNTER — Encounter: Payer: Self-pay | Admitting: Podiatry

## 2019-04-01 ENCOUNTER — Other Ambulatory Visit: Payer: Self-pay

## 2019-04-01 ENCOUNTER — Emergency Department
Admission: EM | Admit: 2019-04-01 | Discharge: 2019-04-01 | Disposition: A | Discharge status: Home / Self Care | Payer: BLUE CROSS/BLUE SHIELD | Attending: Emergency Medicine | Admitting: Emergency Medicine

## 2019-04-01 DIAGNOSIS — R109 Unspecified abdominal pain: Secondary | ICD-10-CM | POA: Diagnosis not present

## 2019-04-01 DIAGNOSIS — R110 Nausea: Secondary | ICD-10-CM | POA: Insufficient documentation

## 2019-04-01 DIAGNOSIS — R197 Diarrhea, unspecified: Secondary | ICD-10-CM | POA: Diagnosis present

## 2019-04-01 DIAGNOSIS — I10 Essential (primary) hypertension: Secondary | ICD-10-CM | POA: Diagnosis not present

## 2019-04-01 DIAGNOSIS — R509 Fever, unspecified: Secondary | ICD-10-CM | POA: Diagnosis not present

## 2019-04-01 DIAGNOSIS — R630 Anorexia: Secondary | ICD-10-CM | POA: Diagnosis not present

## 2019-04-01 DIAGNOSIS — Z79899 Other long term (current) drug therapy: Secondary | ICD-10-CM | POA: Diagnosis not present

## 2019-04-01 LAB — CBC
HCT: 43.7 % (ref 36.0–46.0)
Hemoglobin: 13.8 g/dL (ref 12.0–15.0)
MCH: 27.4 pg (ref 26.0–34.0)
MCHC: 31.6 g/dL (ref 30.0–36.0)
MCV: 86.7 fL (ref 80.0–100.0)
Platelets: 255 10*3/uL (ref 150–400)
RBC: 5.04 MIL/uL (ref 3.87–5.11)
RDW: 11.9 % (ref 11.5–15.5)
WBC: 5.7 10*3/uL (ref 4.0–10.5)
nRBC: 0 % (ref 0.0–0.2)

## 2019-04-01 LAB — COMPREHENSIVE METABOLIC PANEL
ALT: 23 U/L (ref 0–44)
AST: 27 U/L (ref 15–41)
Albumin: 3.7 g/dL (ref 3.5–5.0)
Alkaline Phosphatase: 89 U/L (ref 38–126)
Anion gap: 12 (ref 5–15)
BUN: 18 mg/dL (ref 6–20)
CO2: 24 mmol/L (ref 22–32)
Calcium: 8.6 mg/dL — ABNORMAL LOW (ref 8.9–10.3)
Chloride: 102 mmol/L (ref 98–111)
Creatinine, Ser: 0.99 mg/dL (ref 0.44–1.00)
GFR calc Af Amer: >60 mL/min (ref >60)
GFR calc non Af Amer: >60 mL/min (ref >60)
Glucose, Bld: 106 mg/dL — ABNORMAL HIGH (ref 70–99)
Potassium: 3.2 mmol/L — ABNORMAL LOW (ref 3.5–5.1)
Sodium: 138 mmol/L (ref 135–145)
Total Bilirubin: 1.4 mg/dL — ABNORMAL HIGH (ref 0.3–1.2)
Total Protein: 7.7 g/dL (ref 6.5–8.1)

## 2019-04-01 LAB — LIPASE, BLOOD: Lipase: 34 U/L (ref 11–51)

## 2019-04-01 MED ORDER — ONDANSETRON HCL 4 MG/2ML IJ SOLN
4.0000 mg | Freq: Once | INTRAMUSCULAR | Status: AC
  Administered 2019-04-01: 4 mg via INTRAVENOUS
  Filled 2019-04-01: qty 2

## 2019-04-01 MED ORDER — CIPROFLOXACIN HCL 500 MG PO TABS: 500.0000 mg | ORAL_TABLET | Freq: Two times a day (BID) | ORAL | 0 refills | Status: DC

## 2019-04-01 MED ORDER — SODIUM CHLORIDE 0.9 % IV SOLN
1000.0000 mL | Freq: Once | INTRAVENOUS | Status: AC
  Administered 2019-04-01: 1000 mL via INTRAVENOUS

## 2019-04-01 MED ORDER — SODIUM CHLORIDE 0.9% FLUSH
3.0000 mL | Freq: Once | INTRAVENOUS | Status: AC
  Administered 2019-04-01: 3 mL via INTRAVENOUS

## 2019-04-01 MED ORDER — ONDANSETRON 4 MG PO TBDP: 4.0000 mg | ORAL_TABLET | Freq: Three times a day (TID) | ORAL | 0 refills | Status: DC | PRN

## 2019-04-01 NOTE — ED Triage Notes (Addendum)
Pt c/o black watery diarrhea since Wednesday, states it was normal color until the last 24 hrs with abd cramping and N/V.

## 2019-04-01 NOTE — ED Notes (Signed)
Patient states she has been taking pepto bismal at home to treat symptoms. States she has taken it for the last several days without any improvement in symptoms.

## 2019-04-01 NOTE — ED Provider Notes (Signed)
[facility] Emergency Department Provider Note   ____________________________________________    I have reviewed the triage vital signs and the nursing notes.   HISTORY  Chief Complaint Diarrhea    HPI Regina Flores is a 58 y.o. female who presents with complaints of diarrhea.  Patient reports diarrhea for approximately 4 to 5 days now.  She also reports decreased p.o. intake, mild nausea no vomiting.  Cramping abdominal pain just prior to diarrhea.  Watery brown stool.  Has been taking Pepto-Bismol, stool turned black last night.  Not on blood thinners.  No fevers chills or myalgias.  No travel.  No exposure to COVID positive patients.  Not take anything for this.  Past Medical History:  Diagnosis Date  . Hyperlipidemia   . Hypertension     Patient Active Problem List   Diagnosis Date Noted  . Plantar fasciitis 12/31/2015  . 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 Heel spur 12/31/2015  . Hyperlipidemia, unspecified 10/06/2014  . Hypertension 10/06/2014    Past Surgical History:  Procedure Laterality Date  . BREAST BIOPSY Left 01/27/2014   fibroadenomatoid change     Prior to Admission medications   Medication Sig Start Date End Date Taking? Authorizing Provider  atorvastatin (LIPITOR) 40 MG tablet Take 40 mg by mouth daily.    [provider]  hydrochlorothiazide (HYDRODIURIL) 12.5 MG tablet TK 1 T PO QD 06/05/18   [provider]  losartan (COZAAR) 100 MG tablet TK 1 T PO QD 06/05/18   [provider]  methylPREDNISolone (MEDROL DOSEPAK) 4 MG TBPK tablet 6 day dose pack - take as directed 08/03/18   Edrick Kins, DPM  oxyCODONE-acetaminophen (PERCOCET) 5-325 MG tablet Take 1 tablet by mouth every 6 (six) hours as needed for severe pain. 01/31/19   Edrick Kins, DPM  terbinafine (LAMISIL) 250 MG tablet Take 1 tablet (250 mg total) by mouth daily. 08/03/18   Edrick Kins, DPM     Allergies Patient has no known  allergies.  No family history on file.  Social History Social History   Tobacco Use  . Smoking status: Never Smoker  . Smokeless tobacco: Never Used  Substance Use Topics  . Alcohol use: No    Alcohol/week: 0.0 standard drinks  . Drug use: No    Review of Systems  Constitutional: No fever/chills Eyes: No visual changes.  ENT: No sore throat. Cardiovascular: Denies chest pain. Respiratory: Denies shortness of breath. Gastrointestinal: As above Genitourinary: Negative for dysuria. Musculoskeletal: Negative for back pain. Skin: Negative for rash. Neurological: Negative for headaches or weakness   ____________________________________________   PHYSICAL EXAM:  VITAL SIGNS: ED Triage Vitals  Enc Vitals Group     BP 04/01/19 1235 134/76     Pulse Rate 04/01/19 1235 (!) 107     Resp 04/01/19 1235 18     Temp 04/01/19 1235 99.6 F (37.6 C)     Temp Source 04/01/19 1235 Oral     SpO2 04/01/19 1235 95 %     Weight 04/01/19 1235 90.7 kg (200 lb)     Height 04/01/19 1235 1.626 m (5\' 4" )     Head Circumference --      Peak Flow --      Pain Score 04/01/19 1247 0     Pain Loc --      Pain Edu? --      Excl. in [place]? --    Constitutional: Alert and oriented. No acute distress. Pleasant and  interactive Eyes: Bloodshot Nose: No congestion/rhinnorhea. Mouth/Throat: Mucous membranes are moist.    Cardiovascular: Normal rate, regular rhythm. Kermit Balo peripheral circulation. Respiratory: Normal respiratory effort.  No retractions. Gastrointestinal: Soft and nontender. No distention.  No CVA tenderness.  Musculoskeletal: Warm and well perfused Neurologic:  Normal speech and language. No gross focal neurologic deficits are appreciated.  Skin:  Skin is warm, dry and intact. No rash noted. Psychiatric: Mood and affect are normal. Speech and behavior are normal.  ____________________________________________   LABS (all labs ordered are listed, but only abnormal results are  displayed)  Labs Reviewed  COMPREHENSIVE METABOLIC PANEL - Abnormal; Notable for the following components:      Result Value   Potassium 3.2 (*)    Glucose, Bld 106 (*)    Calcium 8.6 (*)    Total Bilirubin 1.4 (*)    All other components within normal limits  GASTROINTESTINAL PANEL BY PCR, STOOL (REPLACES STOOL CULTURE)  C DIFFICILE QUICK SCREEN W PCR REFLEX  LIPASE, BLOOD  CBC  URINALYSIS, COMPLETE (UACMP) WITH MICROSCOPIC   ____________________________________________  EKG  ED ECG REPORT I, Lavonia Drafts, the attending physician, personally viewed and interpreted this ECG.  Date: 04/01/2019  Rhythm: Sinus tachycardia QRS Axis: normal Intervals: normal ST/T Wave abnormalities: Nonspecific changes Narrative Interpretation: no evidence of acute ischemia  ____________________________________________  RADIOLOGY  None ____________________________________________   PROCEDURES  Procedure(s) performed: No  Procedures   Critical Care performed: No ____________________________________________   INITIAL IMPRESSION / ASSESSMENT AND PLAN / ED COURSE  Pertinent labs & imaging results that were available during my care of the patient were reviewed by me and considered in my medical decision making (see chart for details).  Patient overall well-appearing in no acute distress, no abdominal tenderness palpation.  Mild tachycardia suggests mild dehydration, slightly elevated temperature possible viral versus bacterial cause of diarrhea.  No risk factors for C. difficile and lab work is overall reassuring with normal white blood cell count which argues against C. difficile.  If she is able to give a stool we will send GI panel and culture however for now we will treat with IV fluids, IV Zofran.  Black stool certainly caused by Pepto-Bismol, hemoglobin is normal,  Anticipate discharge    ____________________________________________   FINAL CLINICAL IMPRESSION(S) / ED  DIAGNOSES  Final diagnoses:  Diarrhea of presumed infectious origin        Note:  This document was prepared using Dragon voice recognition software and may include unintentional dictation errors.   Lavonia Drafts, MD 04/01/19 [phone]

## 2019-04-02 ENCOUNTER — Encounter: Payer: BLUE CROSS/BLUE SHIELD | Admitting: Podiatry

## 2019-04-12 ENCOUNTER — Encounter: Payer: Self-pay | Admitting: Podiatry

## 2019-04-12 ENCOUNTER — Ambulatory Visit (INDEPENDENT_AMBULATORY_CARE_PROVIDER_SITE_OTHER): Payer: BC Managed Care – PPO | Admitting: Podiatry

## 2019-04-12 ENCOUNTER — Other Ambulatory Visit: Payer: Self-pay

## 2019-04-12 VITALS — Temp 98.1°F

## 2019-04-12 DIAGNOSIS — Z9889 Other specified postprocedural states: Secondary | ICD-10-CM

## 2019-04-12 DIAGNOSIS — B351 Tinea unguium: Secondary | ICD-10-CM

## 2019-04-12 DIAGNOSIS — M722 Plantar fascial fibromatosis: Secondary | ICD-10-CM

## 2019-04-15 NOTE — Progress Notes (Signed)
   Subjective: 58 y.o. female presenting today status post EPF left. DOS: 01/31/2019. She states she is doing well. She denies any significant pain or modifying factors. Patient is here for further evaluation and treatment.   Past Medical History:  Diagnosis Date  . Hyperlipidemia   . Hypertension     Objective: Physical Exam General: The patient is alert and oriented x3 in no acute distress.  Dermatology: Hyperkeratotic, discolored, thickened, onychodystrophy of bilateral great toenails noted. Skin is warm, dry and supple bilateral lower extremities. Negative for open lesions or macerations.  Vascular: Palpable pedal pulses bilaterally. No edema or erythema noted. Capillary refill within normal limits.  Neurological: Epicritic and protective threshold grossly intact bilaterally.   Musculoskeletal Exam: Range of motion within normal limits to all pedal and ankle joints bilateral. Muscle strength 5/5 in all groups bilateral.   Assessment: #1 Onychomycosis bilateral great toenails #2 Hyperkeratotic nails bilateral great toenails #3 s/p EPF left. DOS: 01/31/2019 - healed   Plan of Care:  #1 Patient was evaluated. #2 May resume full activity with no restrictions.  #3 Continue using custom orthotics.  #4 Continue taking Lamisil 250 mg #90.  #5 Patient mentioned she may want to return to have toenails removed.  #6 Return to clinic as needed.    Edrick Kins, DPM [facility]  Dr. Edrick Kins, [address]                                        [place], [place] [address]                Office [phone]  Fax [phone]

## 2019-04-30 ENCOUNTER — Encounter: Payer: BLUE CROSS/BLUE SHIELD | Admitting: Podiatry

## 2019-06-06 ENCOUNTER — Telehealth: Payer: Self-pay | Admitting: *Deleted

## 2019-06-06 NOTE — Telephone Encounter (Signed)
"  I was just returning your phone call.  I just received it a while ago."

## 2019-06-11 ENCOUNTER — Other Ambulatory Visit: Payer: Self-pay

## 2019-06-11 ENCOUNTER — Other Ambulatory Visit: Payer: Self-pay | Admitting: Family Medicine

## 2019-06-11 ENCOUNTER — Ambulatory Visit: Payer: BC Managed Care – PPO | Admitting: Podiatry

## 2019-06-11 ENCOUNTER — Encounter: Payer: Self-pay | Admitting: Podiatry

## 2019-06-11 DIAGNOSIS — M76821 Posterior tibial tendinitis, right leg: Secondary | ICD-10-CM

## 2019-06-11 DIAGNOSIS — M79676 Pain in unspecified toe(s): Secondary | ICD-10-CM | POA: Diagnosis not present

## 2019-06-11 DIAGNOSIS — B351 Tinea unguium: Secondary | ICD-10-CM | POA: Diagnosis not present

## 2019-06-11 DIAGNOSIS — Z1231 Encounter for screening mammogram for malignant neoplasm of breast: Secondary | ICD-10-CM

## 2019-06-11 MED ORDER — MELOXICAM 15 MG PO TABS: 15.0000 mg | ORAL_TABLET | Freq: Every day | ORAL | 1 refills | Status: DC

## 2019-06-11 MED ORDER — METHYLPREDNISOLONE 4 MG PO TBPK: ORAL_TABLET | ORAL | 0 refills | Status: DC

## 2019-06-13 NOTE — Progress Notes (Signed)
   Subjective: 58 y.o. female presenting today status post EPF left. DOS: 01/31/2019. She reports elongated and thickened bilateral great toenails that cause pain while ambulating in shoes. She is unable to trim them on her own.  She also has a new complaint of pain to the medial right ankle that began about 6 weeks ago. She states the pain began when she returned to work after her surgery. Standing and walking for long periods of time increases the pain. She has not done anything at home for treatment. Patient is here for further evaluation and treatment.   Past Medical History:  Diagnosis Date  . Hyperlipidemia   . Hypertension     Objective: Physical Exam General: The patient is alert and oriented x3 in no acute distress.  Dermatology: Hyperkeratotic, discolored, thickened, onychodystrophy of bilateral great toenails noted. Skin is warm, dry and supple bilateral lower extremities. Negative for open lesions or macerations.  Vascular: Palpable pedal pulses bilaterally. No edema or erythema noted. Capillary refill within normal limits.  Neurological: Epicritic and protective threshold grossly intact bilaterally.   Musculoskeletal Exam: Pain on palpation noted to the posterior tibial tendon of the right foot. Range of motion within normal limits to all pedal and ankle joints bilateral. Muscle strength 5/5 in all groups bilateral.   Assessment: #1 Onychomycosis bilateral great toenails #2 Hyperkeratotic nails bilateral great toenails #3 s/p EPF left. DOS: 01/31/2019 - healed  #4 Insertional posterior tibial tendinitis right   Plan of Care:  #1 Patient was evaluated. #2 Injection of 0.5 mLs Celestone Soluspan injected into the insertion of the posterior tibial tendon sheath of the right lower extremity.  #3 Prescription for Medrol Dose Pak provided to patient. #4 Prescription for Meloxicam provided to patient. #5 Compression anklet dispensed.  #6 Mechanical debridement of bilateral  great toenails performed using a nail nipper. Filed with dremel without incident.  #7 Return to clinic in 4 weeks.     Edrick Kins, DPM [facility]  Dr. Edrick Kins, [address]                                        [place], [place] [address]                Office [phone]  Fax [phone]

## 2019-06-19 ENCOUNTER — Ambulatory Visit: Payer: BC Managed Care – PPO

## 2019-06-19 ENCOUNTER — Other Ambulatory Visit: Payer: Self-pay

## 2019-06-19 DIAGNOSIS — Z23 Encounter for immunization: Secondary | ICD-10-CM

## 2019-07-09 ENCOUNTER — Ambulatory Visit (INDEPENDENT_AMBULATORY_CARE_PROVIDER_SITE_OTHER): Payer: BC Managed Care – PPO | Admitting: Podiatry

## 2019-07-09 ENCOUNTER — Encounter: Payer: Self-pay | Admitting: Podiatry

## 2019-07-09 ENCOUNTER — Other Ambulatory Visit: Payer: Self-pay

## 2019-07-09 DIAGNOSIS — L989 Disorder of the skin and subcutaneous tissue, unspecified: Secondary | ICD-10-CM

## 2019-07-09 DIAGNOSIS — M76821 Posterior tibial tendinitis, right leg: Secondary | ICD-10-CM | POA: Diagnosis not present

## 2019-07-11 NOTE — Progress Notes (Signed)
   Subjective: 58 y.o. female presenting today for follow up evaluation of insertional posterior tibial tendinitis of the right lower extremity. She states she has improved since her last visit but still has some pain. She has been taking Meloxicam for treatment. Being on the foot for long periods of time increases the pain. Patient is here for further evaluation and treatment.    Past Medical History:  Diagnosis Date  . Hyperlipidemia   . Hypertension     Objective: Physical Exam General: The patient is alert and oriented x3 in no acute distress.  Dermatology: Hyperkeratotic lesion(s) present on the right fifth toe. Pain on palpation with a central nucleated core noted. Skin is warm, dry and supple bilateral lower extremities. Negative for open lesions or macerations.  Vascular: Palpable pedal pulses bilaterally. No edema or erythema noted. Capillary refill within normal limits.  Neurological: Epicritic and protective threshold grossly intact bilaterally.   Musculoskeletal Exam: Pain on palpation noted to the posterior tibial tendon of the right foot. Range of motion within normal limits to all pedal and ankle joints bilateral. Muscle strength 5/5 in all groups bilateral.   Assessment: #1 s/p EPF left. 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 #2 Injection of 0.5 mLs Celestone Soluspan injected into the insertion of the posterior tibial tendon sheath of the right lower extremity.  #3 Continue taking Meloxicam daily.  #4 Ankle brace dispensed.  #5 Excisional debridement of keratotic lesion(s) using a chisel blade was performed without incident. Light dressing applied.  #6 Continue using custom orthotics.  #7 Return to clinic in 4 weeks. If not better we will order an MRI.   Works at [employer] from [employer].      Edrick Kins, DPM [facility]   Dr. Edrick Kins, [address]                                        [place], [place] [address]                Office [phone]  Fax [phone]

## 2019-08-06 ENCOUNTER — Ambulatory Visit: Payer: BC Managed Care – PPO | Admitting: Podiatry

## 2019-08-16 ENCOUNTER — Encounter: Payer: Self-pay | Admitting: Podiatry

## 2019-08-16 ENCOUNTER — Other Ambulatory Visit: Payer: Self-pay

## 2019-08-16 ENCOUNTER — Ambulatory Visit: Payer: BC Managed Care – PPO | Admitting: Podiatry

## 2019-08-16 DIAGNOSIS — M76821 Posterior tibial tendinitis, right leg: Secondary | ICD-10-CM | POA: Diagnosis not present

## 2019-08-16 DIAGNOSIS — L989 Disorder of the skin and subcutaneous tissue, unspecified: Secondary | ICD-10-CM

## 2019-08-16 DIAGNOSIS — M722 Plantar fascial fibromatosis: Secondary | ICD-10-CM | POA: Diagnosis not present

## 2019-08-20 NOTE — Progress Notes (Signed)
   Subjective: 58 y.o. female presenting today for follow up evaluation of insertional posterior tibial tendinitis of the right lower extremity. She states she is doing well and has improved significantly. She has been taking Meloxicam, using the ankle brace and custom orthotics as directed. There are no worsening factors noted at this time. Patient is here for further evaluation and treatment.    Past Medical History:  Diagnosis Date  . Hyperlipidemia   . Hypertension     Objective: Physical Exam General: The patient is alert and oriented x3 in no acute distress.  Dermatology: Skin is warm, dry and supple bilateral lower extremities. Negative for open lesions or macerations.  Vascular: Palpable pedal pulses bilaterally. No edema or erythema noted. Capillary refill within normal limits.  Neurological: Epicritic and protective threshold grossly intact bilaterally.   Musculoskeletal Exam: Range of motion within normal limits to all pedal and ankle joints bilateral. Muscle strength 5/5 in all groups bilateral.   Assessment: #1 s/p EPF left. DOS: 01/31/2019 - healed  #2 Insertional posterior tibial tendinitis right - resolved   Plan of Care:  #1 Patient was evaluated. #2 Continue wearing good shoe gear.  #3 Continue taking Meloxicam daily.  #4 Continue using ankle brace.  #5 Continue using custom orthotics.  #6 Return to clinic as needed.   Works at [employer] from [employer].      Edrick Kins, DPM [facility]  Dr. Edrick Kins, [place]                                        [place],  [address]                Office [phone]  Fax [phone]

## 2019-10-08 ENCOUNTER — Ambulatory Visit
Admission: RE | Admit: 2019-10-08 | Discharge: 2019-10-08 | Disposition: A | Discharge status: Home / Self Care | Payer: Self-pay | Source: Ambulatory Visit | Attending: Family Medicine | Admitting: Family Medicine

## 2019-10-08 DIAGNOSIS — Z1231 Encounter for screening mammogram for malignant neoplasm of breast: Secondary | ICD-10-CM | POA: Insufficient documentation

## 2019-12-08 ENCOUNTER — Ambulatory Visit: Payer: BC Managed Care – PPO | Attending: Internal Medicine

## 2019-12-08 DIAGNOSIS — Z23 Encounter for immunization: Secondary | ICD-10-CM | POA: Insufficient documentation

## 2019-12-08 NOTE — Progress Notes (Signed)
   Covid-19 Vaccination Clinic  Name:  Regina Flores    MRN: [id number] DOB: 1961-06-10  12/08/2019  Ms. Aven was observed post Covid-19 immunization for 15 minutes without incident. She was provided with Vaccine Information Sheet and instruction to access the V-Safe system.   Ms. Grotheer was instructed to call 911 with any severe reactions post vaccine: Marland Kitchen Difficulty breathing  . Swelling of face and throat  . A fast heartbeat  . A bad rash all over body  . Dizziness and weakness   Immunizations Administered    Name Date Dose VIS Date Route   Pfizer COVID-19 Vaccine 12/08/2019  1:24 PM 0.3 mL 09/13/2019 Intramuscular   Manufacturer: [place]   Lot: [id number]   [place]: [id number]

## 2019-12-31 ENCOUNTER — Ambulatory Visit: Payer: BC Managed Care – PPO | Attending: Internal Medicine

## 2019-12-31 DIAGNOSIS — Z23 Encounter for immunization: Secondary | ICD-10-CM

## 2019-12-31 NOTE — Progress Notes (Signed)
   Covid-19 Vaccination Clinic  Name:  LADEAN HALVORSEN    MRN: [id number] DOB: 1961-04-20  12/31/2019  Ms. Keysor was observed post Covid-19 immunization for 15 minutes without incident. She was provided with Vaccine Information Sheet and instruction to access the V-Safe system.   Ms. Wydra was instructed to call 911 with any severe reactions post vaccine: Marland Kitchen Difficulty breathing  . Swelling of face and throat  . A fast heartbeat  . A bad rash all over body  . Dizziness and weakness   Immunizations Administered    Name Date Dose VIS Date Route   Pfizer COVID-19 Vaccine 12/31/2019  8:48 AM 0.3 mL 09/13/2019 Intramuscular   Manufacturer: [place]   Lot: [id number] [id number]   [place]: [id number]

## 2020-03-22 IMAGING — MG DIGITAL SCREENING BILAT W/ TOMO W/ CAD
8 series · 8 of 24 positions shown · non-contrast
Comparison: Previous exam(s).

CLINICAL DATA: Screening.

EXAM:
DIGITAL SCREENING BILATERAL MAMMOGRAM WITH TOMO AND CAD

[L CC synth-2D]
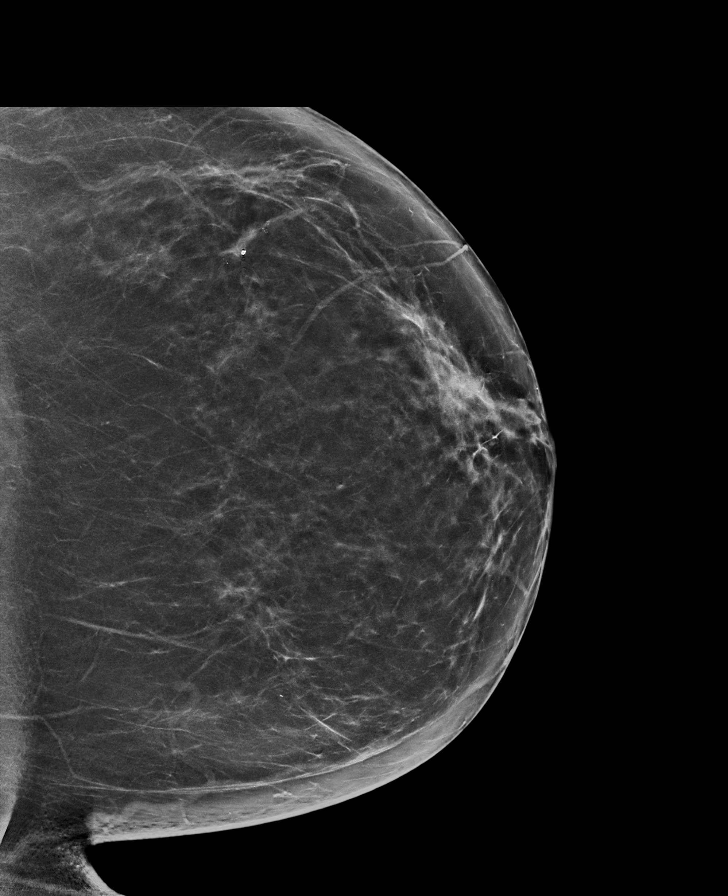

[L MLO synth-2D]
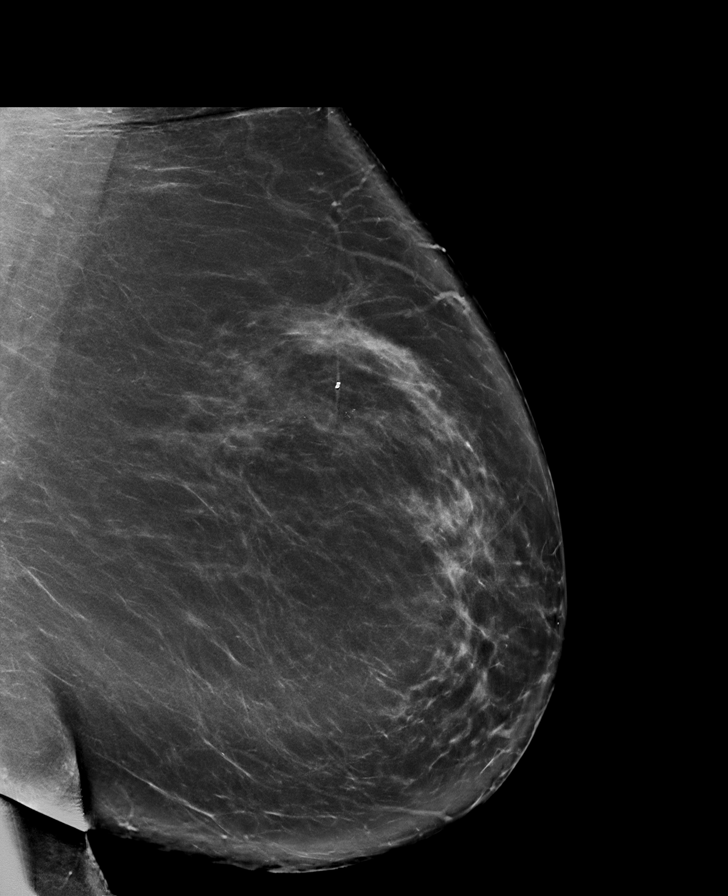

[R MLO synth-2D]
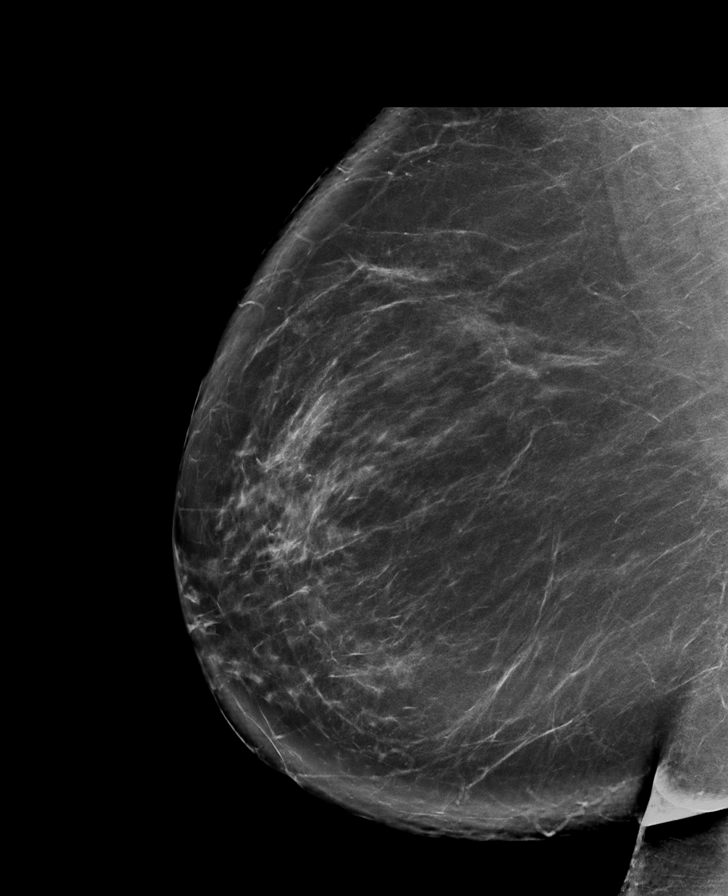

[R CC synth-2D]
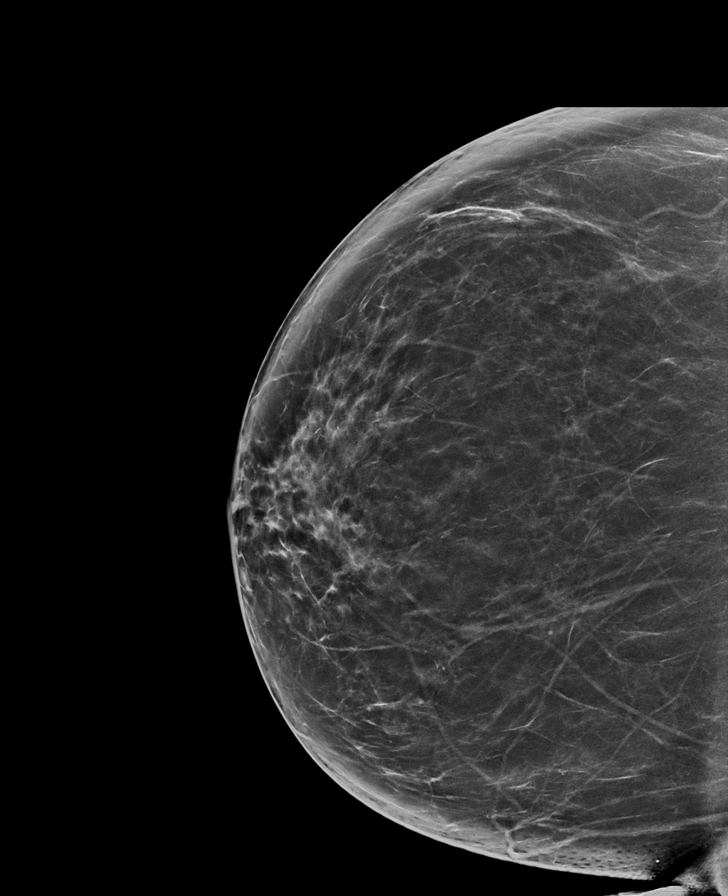

[R MLO tomo · tomo slice 51/101.0]
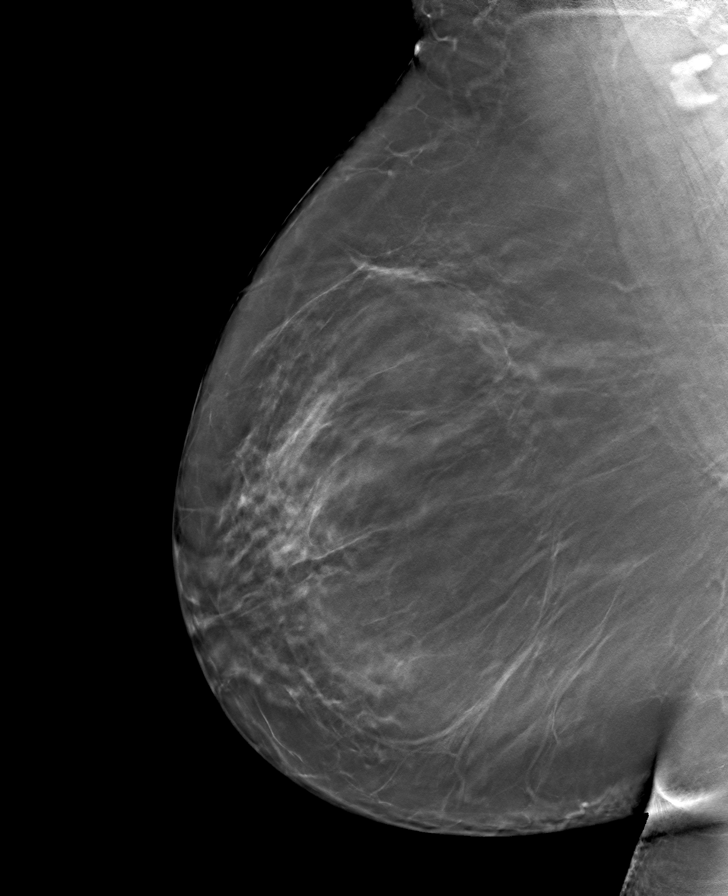

[L MLO tomo · tomo slice 53/106.0]
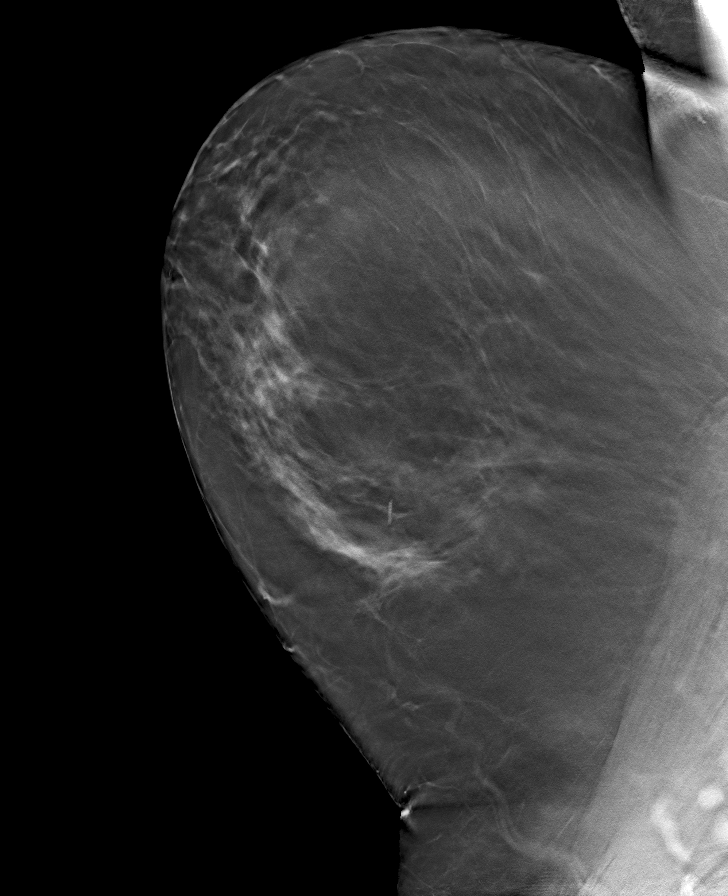

[L CC tomo · tomo slice 47/92.0]
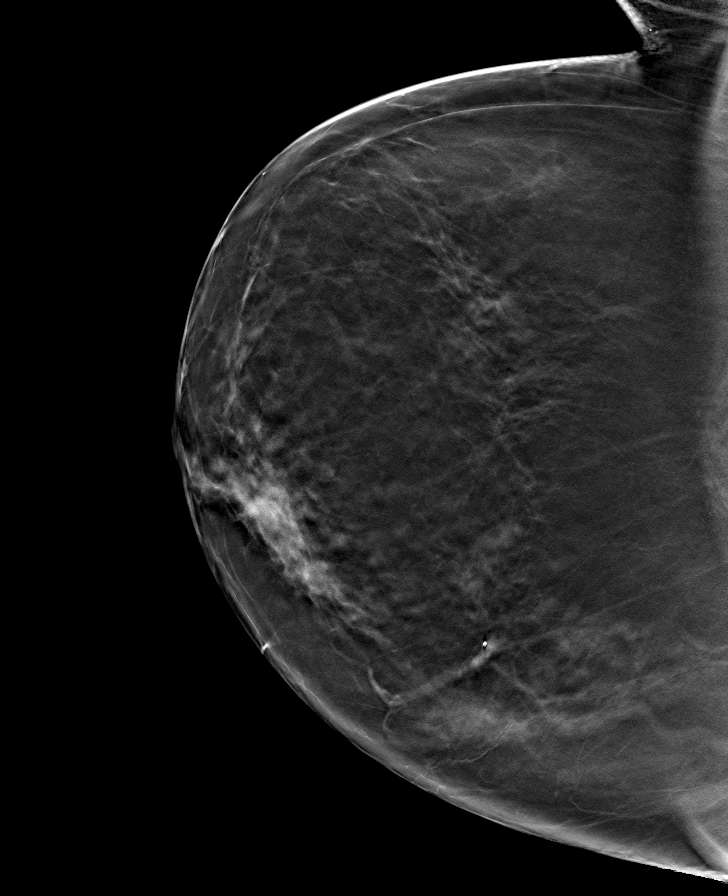

[R CC tomo · tomo slice 45/90.0]
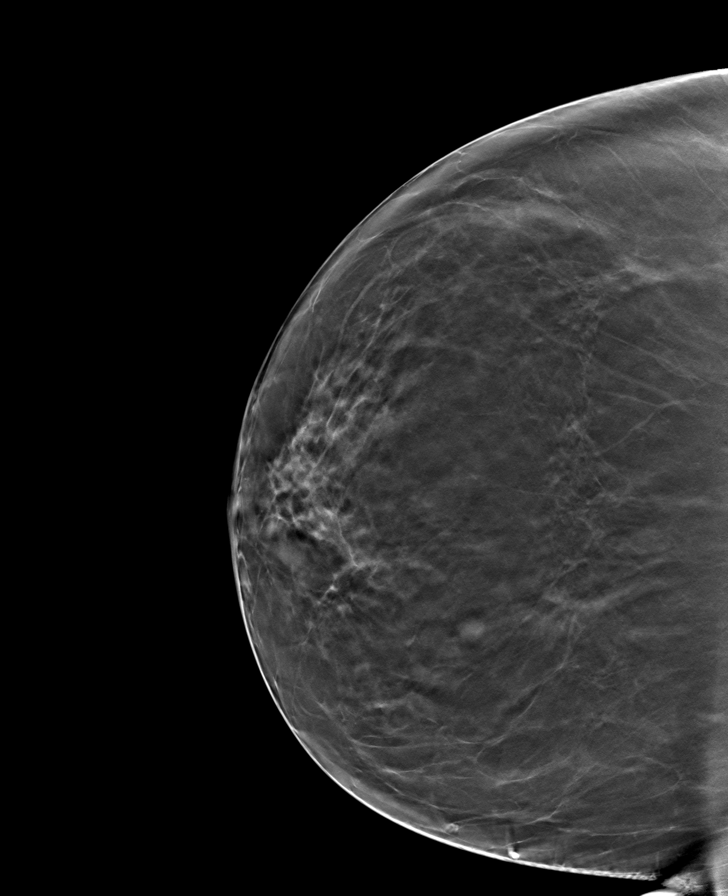

[8 of 24 positions shown; findings below may reference images not displayed]

ACR Breast Density Category b: There are scattered areas of
fibroglandular density.
FINDINGS: There are no findings suspicious for malignancy. Images were
processed with CAD.
IMPRESSION: No mammographic evidence of malignancy. A result letter of this
screening mammogram will be mailed directly to the patient.

RECOMMENDATION:
Screening mammogram in one year. (Code:[id number])

BI-RADS CATEGORY  1: Negative.

## 2020-09-21 ENCOUNTER — Other Ambulatory Visit: Payer: Self-pay | Admitting: Family Medicine

## 2020-09-21 DIAGNOSIS — Z1231 Encounter for screening mammogram for malignant neoplasm of breast: Secondary | ICD-10-CM

## 2020-11-06 ENCOUNTER — Other Ambulatory Visit: Payer: Self-pay

## 2020-11-06 ENCOUNTER — Ambulatory Visit
Admission: RE | Admit: 2020-11-06 | Discharge: 2020-11-06 | Disposition: A | Discharge status: Home / Self Care | Payer: BC Managed Care – PPO | Source: Ambulatory Visit | Attending: Family Medicine | Admitting: Family Medicine

## 2020-11-06 DIAGNOSIS — Z1231 Encounter for screening mammogram for malignant neoplasm of breast: Secondary | ICD-10-CM | POA: Insufficient documentation

## 2021-06-23 ENCOUNTER — Other Ambulatory Visit: Payer: Self-pay

## 2021-06-23 ENCOUNTER — Ambulatory Visit: Payer: Self-pay

## 2021-06-23 DIAGNOSIS — Z23 Encounter for immunization: Secondary | ICD-10-CM

## 2021-11-08 ENCOUNTER — Ambulatory Visit: Payer: BC Managed Care – PPO | Admitting: Medical

## 2021-11-08 ENCOUNTER — Other Ambulatory Visit: Payer: Self-pay

## 2021-11-08 ENCOUNTER — Encounter: Payer: Self-pay | Admitting: Medical

## 2021-11-08 VITALS — BP 156/98 | HR 99 | Temp 98.4°F | Resp 18

## 2021-11-08 DIAGNOSIS — B9689 Other specified bacterial agents as the cause of diseases classified elsewhere: Secondary | ICD-10-CM

## 2021-11-08 DIAGNOSIS — J9801 Acute bronchospasm: Secondary | ICD-10-CM

## 2021-11-08 DIAGNOSIS — R051 Acute cough: Secondary | ICD-10-CM

## 2021-11-08 DIAGNOSIS — J988 Other specified respiratory disorders: Secondary | ICD-10-CM

## 2021-11-08 DIAGNOSIS — Z20822 Contact with and (suspected) exposure to covid-19: Secondary | ICD-10-CM

## 2021-11-08 LAB — POC COVID19 BINAXNOW: SARS Coronavirus 2 Ag: NEGATIVE

## 2021-11-08 MED ORDER — BENZONATATE 100 MG PO CAPS: ORAL_CAPSULE | ORAL | 0 refills | Status: DC

## 2021-11-08 MED ORDER — AZITHROMYCIN 250 MG PO TABS
ORAL_TABLET | ORAL | 0 refills | Status: AC
Start: 2021-11-08 — End: 2021-11-13

## 2021-11-08 MED ORDER — ALBUTEROL SULFATE HFA 108 (90 BASE) MCG/ACT IN AERS: 2.0000 | INHALATION_SPRAY | Freq: Four times a day (QID) | RESPIRATORY_TRACT | 2 refills | Status: DC | PRN

## 2021-11-08 NOTE — Progress Notes (Signed)
° °  Subjective:    Patient ID: Regina Flores, female    DOB: [birth year], 61 y.o.   MRN: [id number]  HPI 61 yo female in non acute distress Fever ,HA cough dry bodyaches,  x 5 days  Review of Systems  Constitutional:  Positive for fatigue and fever. Negative for chills.  HENT:  Positive for congestion, postnasal drip, rhinorrhea and sore throat (better now). Negative for ear pain.   Respiratory:  Positive for cough. Negative for shortness of breath.   Cardiovascular:  Negative for chest pain.  Gastrointestinal:  Negative for abdominal pain, diarrhea and nausea.  Musculoskeletal:  Positive for myalgias.  Skin:  Negative for rash and wound.  Allergic/Immunologic: Negative for environmental allergies and food allergies.  Neurological:  Positive for light-headedness and headaches. Negative for dizziness and syncope.      Objective:   Physical Exam Constitutional:      Appearance: Normal appearance.  HENT:     Head: Normocephalic and atraumatic.     Right Ear: Tympanic membrane, ear canal and external ear normal.     Left Ear: Tympanic membrane, ear canal and external ear normal.     Nose: Congestion present.     Mouth/Throat:     Mouth: Mucous membranes are moist.     Pharynx: Oropharynx is clear.  Eyes:     Extraocular Movements: Extraocular movements intact.     Conjunctiva/sclera: Conjunctivae normal.     Pupils: Pupils are equal, round, and reactive to light.  Cardiovascular:     Rate and Rhythm: Normal rate and regular rhythm.  Pulmonary:     Effort: Pulmonary effort is normal.     Breath sounds: Normal breath sounds.  Musculoskeletal:        General: Normal range of motion.  Skin:    General: Skin is warm and dry.  Neurological:     General: No focal deficit present.     Mental Status: She is alert and oriented to person, place, and time. Mental status is at baseline.  Psychiatric:        Mood and Affect: Mood normal.        Behavior: Behavior normal.         Thought Content: Thought content normal.        Judgment: Judgment normal.      Recent Results (from the past 2160 hour(s))  POC COVID-19     Status: Normal   Collection Time: 11/08/21 11:06 AM  Result Value Ref Range   SARS Coronavirus 2 Ag Negative Negative       Assessment & Plan:  1. Acute cough - benzonatate (TESSALON PERLES) 100 MG capsule; 1-2 capsules by mouth every 8 hours.. for cough  Dispense: 30 capsule; Refill: 0 - albuterol (VENTOLIN HFA) 108 (90 Base) MCG/ACT inhaler; Inhale 2 puffs into the lungs every 6 (six) hours as needed for wheezing or shortness of breath.  Dispense: 8 g; Refill: 2  2. Bronchospasm - albuterol (VENTOLIN HFA) 108 (90 Base) MCG/ACT inhaler; Inhale 2 puffs into the lungs every 6 (six) hours as needed for wheezing or shortness of breath.  Dispense: 8 g; Refill: 2  3. Bacterial respiratory infection - azithromycin (ZITHROMAX) 250 MG tablet; Take 2 tablets on day 1, then 1 tablet daily on days 2 through 5  Dispense: 6 tablet; Refill: 0  4. Encounter for screening laboratory testing for COVID-19 virus - POC COVID-19  Negative Patient verbalizes understanding and has no questions at [place].

## 2022-07-20 ENCOUNTER — Ambulatory Visit (INDEPENDENT_AMBULATORY_CARE_PROVIDER_SITE_OTHER): Payer: Self-pay | Admitting: Nurse Practitioner

## 2022-07-20 ENCOUNTER — Encounter: Payer: Self-pay | Admitting: Nurse Practitioner

## 2022-07-20 VITALS — BP 180/100 | HR 83 | Temp 98.4°F | Ht 64.0 in | Wt 201.4 lb

## 2022-07-20 DIAGNOSIS — J40 Bronchitis, not specified as acute or chronic: Secondary | ICD-10-CM

## 2022-07-20 MED ORDER — BENZONATATE 100 MG PO CAPS: 100.0000 mg | ORAL_CAPSULE | Freq: Three times a day (TID) | ORAL | 0 refills | Status: DC | PRN

## 2022-07-20 MED ORDER — DOXYCYCLINE HYCLATE 100 MG PO TABS: 100.0000 mg | ORAL_TABLET | Freq: Two times a day (BID) | ORAL | 0 refills | Status: AC

## 2022-07-20 NOTE — Progress Notes (Signed)
[facility] [address], [place] [address] [phone]  Office Visit Note  Patient Name: Regina Flores Date of Birth [id number]  Medical Record number [id number]  Date of Service: 07/20/2022  Chief Complaint  Patient presents with   Cough    Started after flu shot on 10/10. Mainly cough, chest hurts, runny nose. Mucus is yellowish/green.      HPI 61 year old female presenting to [facility] clinic with complaints of chest congestion for the past 10 days. She had a flu shot just prior to symptom onset.   Her throat is raw from coughing.  Her cough is productive  She took a home COVID test yesterday that was negative.    Current Medication:  Outpatient Encounter Medications as of 07/20/2022  Medication Sig   atorvastatin (LIPITOR) 40 MG tablet Take 40 mg by mouth daily.   hydrochlorothiazide (HYDRODIURIL) 12.5 MG tablet TK 1 T PO QD   losartan (COZAAR) 100 MG tablet TK 1 T PO QD   albuterol (VENTOLIN HFA) 108 (90 Base) MCG/ACT inhaler Inhale 2 puffs into the lungs every 6 (six) hours as needed for wheezing or shortness of breath.   benzonatate (TESSALON PERLES) 100 MG capsule 1-2 capsules by mouth every 8 hours.. for cough (Patient not taking: Reported on 07/20/2022)   ondansetron (ZOFRAN ODT) 4 MG disintegrating tablet Take 1 tablet (4 mg total) by mouth every 8 (eight) hours as needed. (Patient not taking: Reported on 07/20/2022)   oxyCODONE-acetaminophen (PERCOCET) 5-325 MG tablet Take 1 tablet by mouth every 6 (six) hours as needed for severe pain. (Patient not taking: Reported on 07/20/2022)   selenium sulfide (SELSUN) 2.5 % shampoo Apply to all affected body areas twice a week for 6 weeks (Patient not taking: Reported on 07/20/2022)   No facility-administered encounter medications on file as of 07/20/2022.      Medical History: Past Medical History:  Diagnosis Date   Hyperlipidemia    Hypertension      Vital Signs: BP (!)  180/100 (BP Location: Left Arm, Patient Position: Sitting, Cuff Size: Normal)   Pulse 83   Temp 98.4 F (36.9 C) (Tympanic)   Ht '5\' 4"'$  (1.626 m)   Wt 201 lb 6.4 oz (91.4 kg)   SpO2 98%   BMI 34.57 kg/m    Review of Systems  Constitutional: Negative.   HENT:  Positive for congestion.   Respiratory:  Positive for cough.   Cardiovascular: Negative.   Gastrointestinal: Negative.   Genitourinary: Negative.   Musculoskeletal: Negative.   Neurological: Negative.   Hematological: Negative.     Physical Exam HENT:     Head: Normocephalic.     Right Ear: Tympanic membrane, ear canal and external ear normal.     Left Ear: Tympanic membrane, ear canal and external ear normal.     Nose: Nose normal.     Mouth/Throat:     Pharynx: Posterior oropharyngeal erythema present. No oropharyngeal exudate.  Eyes:     Pupils: Pupils are equal, round, and reactive to light.  Cardiovascular:     Rate and Rhythm: Normal rate and regular rhythm.     Heart sounds: Normal heart sounds.  Pulmonary:     Effort: Pulmonary effort is normal.     Breath sounds: Normal breath sounds.  Musculoskeletal:        General: Normal range of motion.     Cervical back: Normal range of motion.  Skin:    General: Skin  is warm.  Neurological:     General: No focal deficit present.     Mental Status: She is alert.  Psychiatric:        Mood and Affect: Mood normal.       Assessment/Plan: 1. Bronchitis Return to clinic in 2-3 days if no improvement.   Encouraged follow up visit for BP check once URI symptoms have resolved   - benzonatate (TESSALON) 100 MG capsule; Take 1 capsule (100 mg total) by mouth 3 (three) times daily as needed for cough.  Dispense: 30 capsule; Refill: 0 - doxycycline (VIBRA-TABS) 100 MG tablet; Take 1 tablet (100 mg total) by mouth 2 (two) times daily for 10 days.  Dispense: 20 tablet; Refill: 0    General Counseling: [name] verbalizes understanding of the findings of todays visit  and agrees with plan of treatment. I have discussed any further diagnostic evaluation that may be needed or ordered today. We also reviewed her medications today. she has been encouraged to call the office with any questions or concerns that should arise related to todays visit.    Time spent:15 [name] Family Nurse Practitioner

## 2022-10-26 ENCOUNTER — Encounter: Payer: Self-pay | Admitting: Family

## 2022-10-26 ENCOUNTER — Ambulatory Visit (INDEPENDENT_AMBULATORY_CARE_PROVIDER_SITE_OTHER): Payer: Self-pay | Admitting: Family

## 2022-10-26 VITALS — BP 128/90 | HR 107 | Temp 100.5°F | Wt 204.4 lb

## 2022-10-26 DIAGNOSIS — U071 COVID-19: Secondary | ICD-10-CM

## 2022-10-26 LAB — POC SOFIA 2 FLU + SARS ANTIGEN FIA
Influenza A, POC: NEGATIVE
Influenza B, POC: NEGATIVE
SARS Coronavirus 2 Ag: POSITIVE — AB

## 2022-10-26 MED ORDER — NIRMATRELVIR/RITONAVIR (PAXLOVID)TABLET: 3.0000 | ORAL_TABLET | Freq: Two times a day (BID) | ORAL | 0 refills | Status: AC

## 2022-10-26 NOTE — Progress Notes (Signed)
  Subjective:     Patient ID: Regina Flores, female   DOB: November 17, 1960, 62 y.o.   MRN: [id number]   Symptom onset started 4 days ago.   Review of Systems  Constitutional:  Positive for fatigue and fever.  HENT:  Positive for congestion, postnasal drip and sore throat.   Respiratory:  Positive for cough and chest tightness.        Objective:   Physical Exam HENT:     Head: Normocephalic and atraumatic.  Eyes:     Pupils: Pupils are equal, round, and reactive to light.  Cardiovascular:     Rate and Rhythm: Normal rate and regular rhythm.  Pulmonary:     Effort: Pulmonary effort is normal.  Musculoskeletal:        General: Normal range of motion.  Skin:    General: Skin is warm and dry.  Neurological:     Mental Status: She is alert and oriented to person, place, and time.        Assessment:     Test positive for Covid-19 via POC testing     Plan:     Drink plenty of fluids Continue OTC cold medications. Tylenol/Advil for pain and fever Plaxlovid as ordered.  Most recent GFR 89 RTC if condition worsens.  Was reminded of [employer] Covid policy.   DO NOT TAKE PLAXLOVID AND PERCOCET TOGETHER

## 2022-11-02 ENCOUNTER — Encounter: Payer: Self-pay | Admitting: Physician Assistant

## 2022-11-02 ENCOUNTER — Ambulatory Visit (INDEPENDENT_AMBULATORY_CARE_PROVIDER_SITE_OTHER): Payer: Self-pay | Admitting: Physician Assistant

## 2022-11-02 VITALS — HR 96 | Temp 98.8°F

## 2022-11-02 DIAGNOSIS — R058 Other specified cough: Secondary | ICD-10-CM

## 2022-11-02 DIAGNOSIS — J9801 Acute bronchospasm: Secondary | ICD-10-CM

## 2022-11-02 MED ORDER — METHYLPREDNISOLONE 4 MG PO TBPK: ORAL_TABLET | ORAL | 0 refills | Status: DC

## 2022-11-02 NOTE — Progress Notes (Signed)
[facility] [address], [place] [address]   Office Visit Note  Patient Name: Regina Flores Date of Birth [id number]  Medical Record number [id number]  Date of Service: 11/02/2022  Chief Complaint  Patient presents with   Cough    Chest congestion, coughing up thick clear mucus. No fever     62 y/o F presents to the clinic for c/o persistent cough, post nasal drainage, and nasal congestion x 9-10 days. She was seen last week and tested positive for covid. She took Paxlovid and had GI side effects, but finished medicine. She currently denies body aches, chills, fever, CP, SOB, or wheezing.   Cough Associated symptoms include postnasal drip. Pertinent negatives include no sore throat, shortness of breath or wheezing.      Current Medication:  Outpatient Encounter Medications as of 11/02/2022  Medication Sig   albuterol (VENTOLIN HFA) 108 (90 Base) MCG/ACT inhaler Inhale 2 puffs into the lungs every 6 (six) hours as needed for wheezing or shortness of breath.   atorvastatin (LIPITOR) 40 MG tablet Take 40 mg by mouth daily.   benzonatate (TESSALON PERLES) 100 MG capsule 1-2 capsules by mouth every 8 hours.. for cough   benzonatate (TESSALON) 100 MG capsule Take 1 capsule (100 mg total) by mouth 3 (three) times daily as needed for cough.   hydrochlorothiazide (HYDRODIURIL) 12.5 MG tablet TK 1 T PO QD   losartan (COZAAR) 100 MG tablet TK 1 T PO QD   methylPREDNISolone (MEDROL DOSEPAK) 4 MG TBPK tablet Take as directed   ondansetron (ZOFRAN ODT) 4 MG disintegrating tablet Take 1 tablet (4 mg total) by mouth every 8 (eight) hours as needed. (Patient not taking: Reported on 07/20/2022)   oxyCODONE-acetaminophen (PERCOCET) 5-325 MG tablet Take 1 tablet by mouth every 6 (six) hours as needed for severe pain. (Patient not taking: Reported on 07/20/2022)   selenium sulfide (SELSUN) 2.5 % shampoo Apply to all affected body areas twice a week for 6 weeks (Patient not taking:  Reported on 07/20/2022)   No facility-administered encounter medications on file as of 11/02/2022.      Medical History: Past Medical History:  Diagnosis Date   Hyperlipidemia    Hypertension      Vital Signs: Pulse 96   Temp 98.8 F (37.1 C) (Tympanic)   SpO2 97%    Review of Systems  Constitutional: Negative.   HENT:  Positive for congestion and postnasal drip. Negative for sinus pressure, sinus pain, sore throat and trouble swallowing.   Respiratory:  Positive for cough. Negative for chest tightness, shortness of breath and wheezing.   Cardiovascular: Negative.   Neurological: Negative.     Physical Exam Constitutional:      Appearance: Normal appearance.  HENT:     Head: Atraumatic.     Right Ear: Tympanic membrane, ear canal and external ear normal.     Left Ear: Tympanic membrane, ear canal and external ear normal.     Nose: Nose normal.     Right Turbinates: Enlarged and swollen.     Left Turbinates: Enlarged and swollen.     Right Sinus: No maxillary sinus tenderness or frontal sinus tenderness.     Left Sinus: No maxillary sinus tenderness or frontal sinus tenderness.     Mouth/Throat:     Mouth: Mucous membranes are moist.     Pharynx: Oropharynx is clear.  Eyes:     Extraocular Movements: Extraocular movements intact.  Cardiovascular:     Rate  and Rhythm: Normal rate and regular rhythm.  Pulmonary:     Effort: Pulmonary effort is normal.     Breath sounds: Normal breath sounds.  Musculoskeletal:     Cervical back: Neck supple.  Skin:    General: Skin is warm.  Neurological:     Mental Status: She is alert.  Psychiatric:        Mood and Affect: Mood normal.        Behavior: Behavior normal.        Thought Content: Thought content normal.        Judgment: Judgment normal.       Assessment/Plan:  1. Post-viral cough syndrome - methylPREDNISolone (MEDROL DOSEPAK) 4 MG TBPK tablet; Take as directed  Dispense: 1 each; Refill: 0  2.  Bronchospasm - methylPREDNISolone (MEDROL DOSEPAK) 4 MG TBPK tablet; Take as directed  Dispense: 1 each; Refill: 0  Increase fluids Start a humidifier  Use Afrin nasal spray as directed on the box. Do not use spray more than 3 consecutive days. Continue with Mucinex as started yesterday. Defer Sudafed as she has h/o HTN.  Start oral steroids as prescribed. Use Albuterol inhaler as previously prescribed especially at night to reduce cough. Pt received work excuse note to be out of work through 11/04/22.  Pt verbalized understanding and in agreement.    General Counseling: Regina Flores verbalizes understanding of the findings of todays visit and agrees with plan of treatment. I have discussed any further diagnostic evaluation that may be needed or ordered today. We also reviewed her medications today. she has been encouraged to call the office with any questions or concerns that should arise related to todays visit.    Time spent:20 [place], [place] Physician Assistant

## 2024-10-10 ENCOUNTER — Ambulatory Visit (INDEPENDENT_AMBULATORY_CARE_PROVIDER_SITE_OTHER): Payer: Self-pay | Admitting: Adult Health

## 2024-10-10 ENCOUNTER — Other Ambulatory Visit: Payer: Self-pay

## 2024-10-10 ENCOUNTER — Encounter: Payer: Self-pay | Admitting: Adult Health

## 2024-10-10 VITALS — BP 190/120 | HR 84 | Temp 97.7°F | Ht 64.0 in | Wt 208.0 lb

## 2024-10-10 VITALS — BP 170/110

## 2024-10-10 DIAGNOSIS — I10 Essential (primary) hypertension: Secondary | ICD-10-CM

## 2024-10-10 DIAGNOSIS — R051 Acute cough: Secondary | ICD-10-CM

## 2024-10-10 LAB — POC SOFIA 2 FLU + SARS ANTIGEN FIA
Influenza A, POC: NEGATIVE
Influenza B, POC: NEGATIVE
SARS Coronavirus 2 Ag: NEGATIVE

## 2024-10-10 MED ORDER — AZITHROMYCIN 250 MG PO TABS: ORAL_TABLET | ORAL | 0 refills | Status: AC

## 2024-10-10 MED ORDER — PREDNISONE 10 MG PO TABS: ORAL_TABLET | ORAL | 0 refills | Status: AC

## 2024-10-10 NOTE — Progress Notes (Signed)
 [facility]rapist, [facility] [address] [place], [place] [address]   Office Visit Note  Patient Name: Regina Flores Date of Birth [id number]  Medical Record number [id number]  Date of Service: 10/11/2024  Chief Complaint  Patient presents with   Sick visit    Patient c/o nausea, headache, and hot flashes. Denies fever. Symptoms began 6 days ago and progressed over time. Yesterday she had diarrhea and vomited when trying to get ready for work. She has been taking Delsym and Coricidin.      HPI Pt is here for a sick visit. She reports cough, nausea, diarrhea, sore throat, some headache this morning as well.  She reports cough started one week ago. She reports she is vomiting phlegm at times.  She describes coughing fits as well. She has been taking Coricidin and delsym without much improvement. The cough is waking her up at night as well. She has an inhaler but has not been using it.  BP is high, pt reports she only takes Atorvastatin, she was prescribed losartan hydrochlorothiazide but is not taking it.    Current Medication:  Outpatient Encounter Medications as of 10/10/2024  Medication Sig   atorvastatin (LIPITOR) 40 MG tablet Take 40 mg by mouth daily.   azithromycin  (ZITHROMAX ) 250 MG tablet Take 2 tablets on day 1, then 1 tablet daily on days 2 through 5   predniSONE  (DELTASONE ) 10 MG tablet Take 6 tablets (60 mg total) by mouth daily with breakfast for 1 day, THEN 5 tablets (50 mg total) daily with breakfast for 1 day, THEN 4 tablets (40 mg total) daily with breakfast for 1 day, THEN 3 tablets (30 mg total) daily with breakfast for 1 day, THEN 2 tablets (20 mg total) daily with breakfast for 1 day, THEN 1 tablet (10 mg total) daily with breakfast for 1 day.   losartan-hydrochlorothiazide (HYZAAR) 100-12.5 MG tablet Take 1 tablet by mouth daily. (Patient not taking: Reported on 10/10/2024)   [DISCONTINUED] albuterol  (VENTOLIN  HFA) 108 (90 Base) MCG/ACT inhaler Inhale 2 puffs into the lungs  every 6 (six) hours as needed for wheezing or shortness of breath.   [DISCONTINUED] benzonatate  (TESSALON  PERLES) 100 MG capsule 1-2 capsules by mouth every 8 hours.. for cough   [DISCONTINUED] benzonatate  (TESSALON ) 100 MG capsule Take 1 capsule (100 mg total) by mouth 3 (three) times daily as needed for cough.   [DISCONTINUED] hydrochlorothiazide (HYDRODIURIL) 12.5 MG tablet TK 1 T PO QD   [DISCONTINUED] losartan (COZAAR) 100 MG tablet TK 1 T PO QD   [DISCONTINUED] methylPREDNISolone  (MEDROL  DOSEPAK) 4 MG TBPK tablet Take as directed   [DISCONTINUED] ondansetron  (ZOFRAN  ODT) 4 MG disintegrating tablet Take 1 tablet (4 mg total) by mouth every 8 (eight) hours as needed. (Patient not taking: Reported on 07/20/2022)   [DISCONTINUED] oxyCODONE -acetaminophen  (PERCOCET) 5-325 MG tablet Take 1 tablet by mouth every 6 (six) hours as needed for severe pain. (Patient not taking: Reported on 07/20/2022)   [DISCONTINUED] selenium sulfide (SELSUN) 2.5 % shampoo Apply to all affected body areas twice a week for 6 weeks (Patient not taking: Reported on 07/20/2022)   No facility-administered encounter medications on file as of 10/10/2024.      Medical History: Past Medical History:  Diagnosis Date   Hyperlipidemia    Hypertension      Vital Signs: BP (!) 190/120   Pulse 84   Temp 97.7 F (36.5 C)   Ht 5' 4 (1.626 m)   Wt 208 lb (94.3 kg)  SpO2 98%   BMI 35.70 kg/m    Review of Systems  Constitutional:  Negative for chills, fatigue and fever.  HENT:  Negative for congestion.   Eyes:  Negative for pain and itching.  Respiratory:  Positive for cough.   Gastrointestinal:  Positive for nausea.  Neurological:  Positive for headaches.    Physical Exam Vitals reviewed.  Constitutional:      General: She is not in acute distress.    Appearance: She is normal weight. She is not ill-appearing.  HENT:     Head: Normocephalic.     Right Ear: Tympanic membrane and ear canal normal.     Left  Ear: Tympanic membrane and ear canal normal.     Nose: Nose normal.     Mouth/Throat:     Mouth: Mucous membranes are moist.  Eyes:     General:        Right eye: No discharge.        Left eye: No discharge.     Pupils: Pupils are equal, round, and reactive to light.  Cardiovascular:     Rate and Rhythm: Normal rate and regular rhythm.     Pulses: Normal pulses.     Heart sounds: No murmur heard.    No friction rub.  Pulmonary:     Effort: Pulmonary effort is normal. No respiratory distress.     Breath sounds: Normal breath sounds. No wheezing.  Musculoskeletal:     Cervical back: Normal range of motion. No rigidity.  Lymphadenopathy:     Cervical: No cervical adenopathy.  Neurological:     General: No focal deficit present.     Mental Status: She is alert and oriented to person, place, and time.  Psychiatric:        Mood and Affect: Mood normal.        Behavior: Behavior normal.    Assessment/Plan: 1. Acute cough (Primary) Start meds as prescribed.  - POC SOFIA 2 FLU + SARS ANTIGEN FIA - CBC w/Diff - Comprehensive metabolic panel - azithromycin  (ZITHROMAX ) 250 MG tablet; Take 2 tablets on day 1, then 1 tablet daily on days 2 through 5  Dispense: 6 tablet; Refill: 0 - predniSONE  (DELTASONE ) 10 MG tablet; Take 6 tablets (60 mg total) by mouth daily with breakfast for 1 day, THEN 5 tablets (50 mg total) daily with breakfast for 1 day, THEN 4 tablets (40 mg total) daily with breakfast for 1 day, THEN 3 tablets (30 mg total) daily with breakfast for 1 day, THEN 2 tablets (20 mg total) daily with breakfast for 1 day, THEN 1 tablet (10 mg total) daily with breakfast for 1 day.  Dispense: 21 tablet; Refill: 0  2. Hypertension, unspecified type PT to go home, and take Losartan hydrochlorothiazide, and come back at 3pm for BP recheck.      General Counseling: Regina Flores verbalizes understanding of the findings of todays visit and agrees with plan of treatment. I have discussed any  further diagnostic evaluation that may be needed or ordered today. We also reviewed her medications today. she has been encouraged to call the office with any questions or concerns that should arise related to todays visit.   Orders Placed This Encounter  Procedures   CBC w/Diff   Comprehensive metabolic panel   POC SOFIA 2 FLU + SARS ANTIGEN FIA    Meds ordered this encounter  Medications   azithromycin  (ZITHROMAX ) 250 MG tablet    Sig: Take 2 tablets on day 1,  then 1 tablet daily on days 2 through 5    Dispense:  6 tablet    Refill:  0   predniSONE  (DELTASONE ) 10 MG tablet    Sig: Take 6 tablets (60 mg total) by mouth daily with breakfast for 1 day, THEN 5 tablets (50 mg total) daily with breakfast for 1 day, THEN 4 tablets (40 mg total) daily with breakfast for 1 day, THEN 3 tablets (30 mg total) daily with breakfast for 1 day, THEN 2 tablets (20 mg total) daily with breakfast for 1 day, THEN 1 tablet (10 mg total) daily with breakfast for 1 day.    Dispense:  21 tablet    Refill:  0    Time spent:15 Minutes    Juliene DOROTHA Howells AGNP-C Nurse Practitioner

## 2024-10-11 LAB — COMPREHENSIVE METABOLIC PANEL WITH GFR
ALT: 11 IU/L (ref 0–32)
AST: 16 IU/L (ref 0–40)
Albumin: 4.3 g/dL (ref 3.9–4.9)
Alkaline Phosphatase: 121 IU/L (ref 49–135)
BUN/Creatinine Ratio: 10 — ABNORMAL LOW (ref 12–28)
BUN: 9 mg/dL (ref 8–27)
Bilirubin Total: 0.9 mg/dL (ref 0.0–1.2)
CO2: 23 mmol/L (ref 20–29)
Calcium: 9.5 mg/dL (ref 8.7–10.3)
Chloride: 103 mmol/L (ref 96–106)
Creatinine, Ser: 0.87 mg/dL (ref 0.57–1.00)
Globulin, Total: 2.7 g/dL (ref 1.5–4.5)
Glucose: 88 mg/dL (ref 70–99)
Potassium: 4.7 mmol/L (ref 3.5–5.2)
Sodium: 143 mmol/L (ref 134–144)
Total Protein: 7 g/dL (ref 6.0–8.5)
eGFR: 75 mL/min/1.73

## 2024-10-11 LAB — CBC WITH DIFFERENTIAL/PLATELET
Basophils Absolute: 0 x10E3/uL (ref 0.0–0.2)
Basos: 1 %
EOS (ABSOLUTE): 0.2 x10E3/uL (ref 0.0–0.4)
Eos: 3 %
Hematocrit: 43 % (ref 34.0–46.6)
Hemoglobin: 13.9 g/dL (ref 11.1–15.9)
Immature Grans (Abs): 0 x10E3/uL (ref 0.0–0.1)
Immature Granulocytes: 0 %
Lymphocytes Absolute: 1.7 x10E3/uL (ref 0.7–3.1)
Lymphs: 21 %
MCH: 28.4 pg (ref 26.6–33.0)
MCHC: 32.3 g/dL (ref 31.5–35.7)
MCV: 88 fL (ref 79–97)
Monocytes Absolute: 0.5 x10E3/uL (ref 0.1–0.9)
Monocytes: 6 %
Neutrophils Absolute: 5.7 x10E3/uL (ref 1.4–7.0)
Neutrophils: 69 %
Platelets: 256 x10E3/uL (ref 150–450)
RBC: 4.9 x10E6/uL (ref 3.77–5.28)
RDW: 12.2 % (ref 11.7–15.4)
WBC: 8.2 x10E3/uL (ref 3.4–10.8)

## 2024-10-11 NOTE — Progress Notes (Signed)
 Pt here for follow up on bp.  Mild improvement in pressure, but headache has resolved.  She will take another tablet tomorrow morning and follow up with us  for bp check or check it herself and call us  if she is able to get a cuff.

## 2024-10-23 ENCOUNTER — Ambulatory Visit: Payer: Self-pay

## 2024-10-23 DIAGNOSIS — K640 First degree hemorrhoids: Secondary | ICD-10-CM | POA: Diagnosis not present

## 2024-10-23 DIAGNOSIS — Z1211 Encounter for screening for malignant neoplasm of colon: Secondary | ICD-10-CM | POA: Diagnosis present

## 2024-10-23 DIAGNOSIS — K5730 Diverticulosis of large intestine without perforation or abscess without bleeding: Secondary | ICD-10-CM | POA: Diagnosis not present
